# Patient Record
Sex: Female | Born: 1950 | Race: Black or African American | Hispanic: No | Marital: Married | Smoking: Former smoker
Health system: Southern US, Community
[De-identification: ages and names within clinical notes are randomized; demographics above are authoritative.]

## PROBLEM LIST (undated history)

## (undated) ENCOUNTER — Emergency Department (HOSPITAL_COMMUNITY): Admission: EM | Payer: Medicare Other | Source: Home / Self Care

## (undated) DIAGNOSIS — F32A Depression, unspecified: Secondary | ICD-10-CM

## (undated) DIAGNOSIS — T7840XA Allergy, unspecified, initial encounter: Secondary | ICD-10-CM

## (undated) DIAGNOSIS — I10 Essential (primary) hypertension: Secondary | ICD-10-CM

## (undated) DIAGNOSIS — F329 Major depressive disorder, single episode, unspecified: Secondary | ICD-10-CM

## (undated) DIAGNOSIS — J42 Unspecified chronic bronchitis: Secondary | ICD-10-CM

## (undated) DIAGNOSIS — I499 Cardiac arrhythmia, unspecified: Secondary | ICD-10-CM

## (undated) DIAGNOSIS — J449 Chronic obstructive pulmonary disease, unspecified: Secondary | ICD-10-CM

## (undated) DIAGNOSIS — F419 Anxiety disorder, unspecified: Secondary | ICD-10-CM

## (undated) HISTORY — DX: Anxiety disorder, unspecified: F41.9

## (undated) HISTORY — DX: Chronic obstructive pulmonary disease, unspecified: J44.9

## (undated) HISTORY — PX: ABDOMINAL HYSTERECTOMY: SHX81

## (undated) HISTORY — DX: Essential (primary) hypertension: I10

## (undated) HISTORY — DX: Cardiac arrhythmia, unspecified: I49.9

## (undated) HISTORY — DX: Depression, unspecified: F32.A

## (undated) HISTORY — PX: BREAST EXCISIONAL BIOPSY: SUR124

## (undated) HISTORY — DX: Unspecified chronic bronchitis: J42

## (undated) HISTORY — PX: BREAST LUMPECTOMY: SHX2

## (undated) HISTORY — DX: Major depressive disorder, single episode, unspecified: F32.9

## (undated) HISTORY — DX: Allergy, unspecified, initial encounter: T78.40XA

---

## 2010-10-11 HISTORY — PX: COLONOSCOPY: SHX174

## 2016-01-05 DIAGNOSIS — R05 Cough: Secondary | ICD-10-CM | POA: Diagnosis not present

## 2016-01-05 DIAGNOSIS — I10 Essential (primary) hypertension: Secondary | ICD-10-CM | POA: Diagnosis not present

## 2016-08-31 ENCOUNTER — Ambulatory Visit (INDEPENDENT_AMBULATORY_CARE_PROVIDER_SITE_OTHER): Payer: Medicare Other | Admitting: Nurse Practitioner

## 2016-08-31 ENCOUNTER — Encounter: Payer: Self-pay | Admitting: Nurse Practitioner

## 2016-08-31 VITALS — BP 126/84 | HR 58 | Temp 98.0°F | Resp 16 | Ht 59.0 in | Wt 150.0 lb

## 2016-08-31 DIAGNOSIS — J209 Acute bronchitis, unspecified: Secondary | ICD-10-CM | POA: Diagnosis not present

## 2016-08-31 DIAGNOSIS — J309 Allergic rhinitis, unspecified: Secondary | ICD-10-CM

## 2016-08-31 DIAGNOSIS — I10 Essential (primary) hypertension: Secondary | ICD-10-CM | POA: Diagnosis not present

## 2016-08-31 DIAGNOSIS — J42 Unspecified chronic bronchitis: Secondary | ICD-10-CM | POA: Insufficient documentation

## 2016-08-31 MED ORDER — FLUTICASONE PROPIONATE 50 MCG/ACT NA SUSP: 2.0000 | Freq: Every day | NASAL | 0 refills | Status: DC

## 2016-08-31 MED ORDER — CETIRIZINE HCL 10 MG PO TABS: 10.0000 mg | ORAL_TABLET | Freq: Every day | ORAL | 0 refills | Status: DC

## 2016-08-31 MED ORDER — ALBUTEROL SULFATE HFA 108 (90 BASE) MCG/ACT IN AERS: 1.0000 | INHALATION_SPRAY | Freq: Four times a day (QID) | RESPIRATORY_TRACT | 6 refills | Status: DC | PRN

## 2016-08-31 MED ORDER — VALSARTAN-HYDROCHLOROTHIAZIDE 160-12.5 MG PO TABS: 1.0000 | ORAL_TABLET | Freq: Every day | ORAL | 1 refills | Status: DC

## 2016-08-31 NOTE — Progress Notes (Signed)
Subjective:    Patient ID: Phyllis Johnson, female    DOB: July 02, 1951, 65 y.o.   MRN: [id number]  Patient presents today for complete physical or establish care (new patient).  Cough  Associated symptoms include shortness of breath. Pertinent negatives include no chest pain, fever, headaches, heartburn, myalgias, rash, sore throat, weight loss or wheezing. The symptoms are aggravated by lying down and exercise. She has tried nothing for the symptoms. Her past medical history is significant for bronchitis and COPD.  diagnosed with chronic bronchitis by previous pcp in [place]. Pulmonary function test was done. Denies used of any inhalers (states she only believes in healthy diet and regular exercise).  Previous pcp was Dr. Joya Salm ([place] in [place]), last seen 59months ago.  Immunizations: (TDAP, Hep C screen, Pneumovax, Influenza, zoster): Declined any immunization today. Health Maintenance  Topic Date Due  .  Hepatitis C: One time screening is recommended by Center for Disease Control  (CDC) for  adults born from [birth year] through 1963.   07-Jan-1951  . HIV Screening  01/12/1968  . Pap Smear  01/11/1974  . Mammogram  01/12/2003  . Colon Cancer Screening  01/12/2003  . Shingles Vaccine  01/11/2013  . Flu Shot  01/09/2017*  . Tetanus Vaccine  10/11/2018*  *Topic was postponed. The date shown is not the original due date.   Diet:heart healthy Weight:  Wt Readings from Last 3 Encounters:  08/31/16 150 lb (68 kg)   Exercise:walking Fall Risk:no No flowsheet data found. Home Safety: home alone, seperated from husband, retired Network engineer, moved from [place] to [facility] 02/2016 Depression/Suicide:denies, in remission with counseling and prayers No flowsheet data found. No flowsheet data found. Colonoscopy (every 5-97yrs, >50-51yrs):last done 2006, normal per patient Dexa (every 2-27yrs, >43yrs):last done 2006, reports osteopenia Pap Smear (every 30yrs for >21-29 without HPV, every 74yrs for  >30-83yrs with HPV):s/p hysterectomy Mammogram (yearly, >72yrs):due  Vision: due Dental: up to date Advanced Directive:no, wants information No flowsheet data found. Sexual History (birth control, marital status, STD):not sexually active  Medications and allergies reviewed with patient and updated if appropriate.  Patient Active Problem List   Diagnosis Date Noted  . HTN (hypertension), benign 08/31/2016  . Chronic bronchitis with acute exacerbation ([place]) 08/31/2016  . Chronic allergic rhinitis 08/31/2016    No current outpatient prescriptions on file prior to visit.   No current facility-administered medications on file prior to visit.     Past Medical History:  Diagnosis Date  . Allergy   . Anxiety    under control with prayer and exercise.  . Arrhythmia   . Chronic bronchitis ([place])   . COPD (chronic obstructive pulmonary disease) (HCC)    chronic bronchitis  . Depression    in remission with counselling  . Hypertension     Past Surgical History:  Procedure Laterality Date  . ABDOMINAL HYSTERECTOMY     with oophrectomy, secondary to large uterine fibriods  . BREAST LUMPECTOMY     R side    Social History   Social History  . Marital status: Unknown    Spouse name: N/A  . Number of children: N/A  . Years of education: N/A   Social History Main Topics  . Smoking status: Former Smoker    Years: 15.00    Types: Cigarettes    Quit date: 2008  . Smokeless tobacco: Never Used  . Alcohol use No  . Drug use: No  . Sexual activity: Not Asked   Other Topics Concern  .  None   Social History Narrative  . None    Family History  Problem Relation Age of Onset  . Hypertension Father   . Stroke Father   . Heart disease Father   . COPD Sister   . Cancer Sister     lung secondary to tobacco use  . Fibroids Sister     uterine  . Cancer Cousin     breast        Review of Systems  Constitutional: Negative for fever, malaise/fatigue and weight loss.    HENT: Negative for congestion and sore throat.   Eyes:       Negative for visual changes  Respiratory: Positive for cough and shortness of breath. Negative for sputum production and wheezing.   Cardiovascular: Negative for chest pain, palpitations and leg swelling.  Gastrointestinal: Negative for blood in stool, constipation, diarrhea and heartburn.  Genitourinary: Negative for dysuria, frequency and urgency.  Musculoskeletal: Negative for falls, joint pain and myalgias.  Skin: Negative for rash.  Neurological: Negative for dizziness, sensory change and headaches.  Endo/Heme/Allergies: Does not bruise/bleed easily.  Psychiatric/Behavioral: Negative for depression, substance abuse and suicidal ideas. The patient is not nervous/anxious and does not have insomnia.     Objective:   Vitals:   08/31/16 1110  BP: 126/84  Pulse: (!) 58  Resp: 16  Temp: 98 F (36.7 C)    Body mass index is 30.3 kg/m.   Physical Examination:  Physical Exam  Constitutional: She is oriented to person, place, and time and well-developed, well-nourished, and in no distress. No distress.  HENT:  Right Ear: External ear normal.  Left Ear: External ear normal.  Nose: Nose normal.  Mouth/Throat: Oropharynx is clear and moist. No oropharyngeal exudate.  Eyes: Conjunctivae and EOM are normal. Pupils are equal, round, and reactive to light. No scleral icterus.  Neck: Normal range of motion. Neck supple. No thyromegaly present.  Cardiovascular: Normal rate, normal heart sounds and intact distal pulses.   Pulmonary/Chest: Effort normal and breath sounds normal. She exhibits no tenderness.  Abdominal: Soft. Bowel sounds are normal. She exhibits no distension. There is no tenderness.  Musculoskeletal: Normal range of motion. She exhibits no edema or tenderness.  Lymphadenopathy:    She has no cervical adenopathy.  Neurological: She is alert and oriented to person, place, and time. Gait normal.  Skin: Skin is  warm and dry.  Psychiatric: Affect and judgment normal.    ASSESSMENT and PLAN:  Diagnoses and all orders for this visit:  Chronic bronchitis with acute exacerbation (HCC) -     albuterol (PROVENTIL HFA;VENTOLIN HFA) 108 (90 Base) MCG/ACT inhaler; Inhale 1-2 puffs into the lungs every 6 (six) hours as needed for wheezing or shortness of breath.  HTN (hypertension), benign -     valsartan-hydrochlorothiazide (DIOVAN-HCT) 160-12.5 MG tablet; Take 1 tablet by mouth daily. -     Basic metabolic panel; Future  Chronic allergic rhinitis, unspecified seasonality, unspecified trigger -     cetirizine (ZYRTEC) 10 MG tablet; Take 1 tablet (10 mg total) by mouth daily. -     fluticasone (FLONASE) 50 MCG/ACT nasal spray; Place 2 sprays into both nostrils daily.    No problem-specific Assessment & Plan notes found for this encounter.      Follow up: Return in about 1 month (around 09/30/2016) for CPE.  Wilfred Lacy, NP

## 2016-08-31 NOTE — Patient Instructions (Addendum)
Need medical records from previous provider. Will get lab draw with CPE in December.  Chronic Obstructive Pulmonary Disease Exacerbation Chronic obstructive pulmonary disease (COPD) is a common lung problem. In COPD, the flow of air from the lungs is limited. COPD exacerbations are times that breathing gets worse and you need extra treatment. Without treatment they can be life threatening. If they happen often, your lungs can become more damaged. If your COPD gets worse, your doctor may treat you with:  Medicines.  Oxygen.  Different ways to clear your airway, such as using a mask. Follow these instructions at home:  Do not smoke.  Avoid tobacco smoke and other things that bother your lungs.  If given, take your antibiotic medicine as told. Finish the medicine even if you start to feel better.  Only take medicines as told by your doctor.  Drink enough fluids to keep your pee (urine) clear or pale yellow (unless your doctor has told you not to).  Use a cool mist machine (vaporizer).  If you use oxygen or a machine that turns liquid medicine into a mist (nebulizer), continue to use them as told.  Keep up with shots (vaccinations) as told by your doctor.  Exercise regularly.  Eat healthy foods.  Keep all doctor visits as told. Get help right away if:  You are very short of breath and it gets worse.  You have trouble talking.  You have bad chest pain.  You have blood in your spit (sputum).  You have a fever.  You keep throwing up (vomiting).  You feel weak, or you pass out (faint).  You feel confused.  You keep getting worse. This information is not intended to replace advice given to you by your health care provider. Make sure you discuss any questions you have with your health care provider. Document Released: 09/16/2011 Document Revised: 03/04/2016 Document Reviewed: 06/01/2013 Elsevier Interactive Patient Education  2017 [employer].

## 2016-08-31 NOTE — Progress Notes (Signed)
Pre visit review using our clinic review tool, if applicable. No additional management support is needed unless otherwise documented below in the visit note. 

## 2016-09-18 ENCOUNTER — Emergency Department (HOSPITAL_COMMUNITY)
Admission: EM | Admit: 2016-09-18 | Discharge: 2016-09-18 | Disposition: A | Discharge status: Home / Self Care | Payer: Medicare Other | Attending: Emergency Medicine | Admitting: Emergency Medicine

## 2016-09-18 ENCOUNTER — Emergency Department (HOSPITAL_COMMUNITY): Payer: Medicare Other

## 2016-09-18 ENCOUNTER — Encounter (HOSPITAL_COMMUNITY): Payer: Self-pay

## 2016-09-18 DIAGNOSIS — Z87891 Personal history of nicotine dependence: Secondary | ICD-10-CM | POA: Insufficient documentation

## 2016-09-18 DIAGNOSIS — J449 Chronic obstructive pulmonary disease, unspecified: Secondary | ICD-10-CM | POA: Insufficient documentation

## 2016-09-18 DIAGNOSIS — R0789 Other chest pain: Secondary | ICD-10-CM | POA: Insufficient documentation

## 2016-09-18 DIAGNOSIS — F419 Anxiety disorder, unspecified: Secondary | ICD-10-CM | POA: Diagnosis not present

## 2016-09-18 DIAGNOSIS — R42 Dizziness and giddiness: Secondary | ICD-10-CM | POA: Diagnosis not present

## 2016-09-18 DIAGNOSIS — I10 Essential (primary) hypertension: Secondary | ICD-10-CM | POA: Diagnosis not present

## 2016-09-18 DIAGNOSIS — R079 Chest pain, unspecified: Secondary | ICD-10-CM | POA: Diagnosis not present

## 2016-09-18 LAB — CBC WITH DIFFERENTIAL/PLATELET
BASOS PCT: 0 %
Basophils Absolute: 0 10*3/uL (ref 0.0–0.1)
Eosinophils Absolute: 0.1 10*3/uL (ref 0.0–0.7)
Eosinophils Relative: 2 %
HCT: 39.9 % (ref 36.0–46.0)
HEMOGLOBIN: 13.5 g/dL (ref 12.0–15.0)
Lymphocytes Relative: 48 %
Lymphs Abs: 2.2 10*3/uL (ref 0.7–4.0)
MCH: 29.8 pg (ref 26.0–34.0)
MCHC: 33.8 g/dL (ref 30.0–36.0)
MCV: 88.1 fL (ref 78.0–100.0)
MONOS PCT: 7 %
Monocytes Absolute: 0.3 10*3/uL (ref 0.1–1.0)
NEUTROS PCT: 43 %
Neutro Abs: 2 10*3/uL (ref 1.7–7.7)
Platelets: 308 10*3/uL (ref 150–400)
RBC: 4.53 MIL/uL (ref 3.87–5.11)
RDW: 13.4 % (ref 11.5–15.5)
WBC: 4.6 10*3/uL (ref 4.0–10.5)

## 2016-09-18 LAB — BASIC METABOLIC PANEL
ANION GAP: 11 (ref 5–15)
BUN: 7 mg/dL (ref 6–20)
CALCIUM: 9.3 mg/dL (ref 8.9–10.3)
CO2: 23 mmol/L (ref 22–32)
Chloride: 100 mmol/L — ABNORMAL LOW (ref 101–111)
Creatinine, Ser: 0.7 mg/dL (ref 0.44–1.00)
GFR calc Af Amer: >60 mL/min (ref >60)
GLUCOSE: 102 mg/dL — AB (ref 65–99)
Potassium: 3.3 mmol/L — ABNORMAL LOW (ref 3.5–5.1)
Sodium: 134 mmol/L — ABNORMAL LOW (ref 135–145)

## 2016-09-18 LAB — I-STAT TROPONIN, ED
TROPONIN I, POC: 0 ng/mL (ref 0.00–0.08)
Troponin i, poc: 0 ng/mL (ref 0.00–0.08)

## 2016-09-18 MED ORDER — HYDROXYZINE HCL 25 MG PO TABS
50.0000 mg | ORAL_TABLET | Freq: Once | ORAL | Status: AC
  Administered 2016-09-18: 50 mg via ORAL
  Filled 2016-09-18: qty 2

## 2016-09-18 MED ORDER — ASPIRIN 81 MG PO CHEW
324.0000 mg | CHEWABLE_TABLET | Freq: Once | ORAL | Status: AC
  Administered 2016-09-18: 324 mg via ORAL
  Filled 2016-09-18: qty 4

## 2016-09-18 MED ORDER — HYDROXYZINE PAMOATE 25 MG PO CAPS: 25.0000 mg | ORAL_CAPSULE | Freq: Three times a day (TID) | ORAL | 0 refills | Status: DC | PRN

## 2016-09-18 NOTE — ED Triage Notes (Signed)
Pt. Having HTN 168/100 and pt. gpt 160/11 and pt. Took her Losartin 160-12.5 1 1/2 tablets,  PTAR rechecked her BP and it was 140/90.  Pt. Is feeling anxious and lightheaded and dizziness resolved.  Skin is warm and dry.

## 2016-09-18 NOTE — ED Notes (Signed)
Pt ambulated to and from the BR with no difficulty or assistance.

## 2016-09-18 NOTE — Discharge Instructions (Signed)
Please start taking a daily 81 mg aspirin.  Do not hesitate to return to the emergency department for any new, worsening or concerning symptoms.   Please consider following with cardiology for cardio allergy evaluation and possible repeat stress testing.

## 2016-09-18 NOTE — ED Provider Notes (Signed)
  Physical Exam  BP 135/91   Pulse (!) 56 Comment: Simultaneous filing. User may not have seen previous data.  Temp 97.6 F (36.4 C) (Oral)   Resp 12 Comment: Simultaneous filing. User may not have seen previous data.  Ht 4\' 11"  (1.499 m)   Wt 65.8 kg   SpO2 97% Comment: Simultaneous filing. User may not have seen previous data.  BMI 29.29 kg/m   Physical Exam  ED Course  Procedures  MDM Patient presented with lightheadedness has also nausea mildly elevated blood pressure per home readings. Blood pressures have been fine here without any signs of end organ damage on exam. EKG was noted to ischemic changes. First troponin negative, awaiting adult troponin  Delta trop and home, next at 1700  Second troponin is negative. Patient is reassured and agrees with plan follow-up with primary care provider for anxiety and for optimal hypertension control. Vital signs stable time of discharge. Strict return precautions are given.   Dewaine Conger, MD 09/18/16 [id number]    Pattricia Boss, MD 09/22/16 1324

## 2016-09-18 NOTE — ED Provider Notes (Signed)
[facility] DEPT Provider Note   CSN: [id number] Arrival date & time: 09/18/16  1255     History   Chief Complaint Chief Complaint  Patient presents with  . Hypertension  . Anxiety    HPI   Blood pressure 141/87, pulse 71, temperature 97.6 F (36.4 C), temperature source Oral, resp. rate (!) 28, height 4\' 11"  (1.499 m), weight 65.8 kg, SpO2 94 %.  Phyllis Johnson is a 65 y.o. female complaining of feeling lightheaded and slightly nauseous this morning at about 10 AM, she has a left-sided chest pressure very mild, 1 out of 10, no exacerbating or alleviating symptoms also onset at about 10 AM, she felt like her blood sugar was low, she checked her blood pressure is 168/100 client with her losartan. Lightheadedness and dizziness have resolved however she has some mild left-sided chest pressure. Former smoker with over 50-pack-year history, quit 10 years ago. She denies diabetes, hyperlipidemia, endorses family history of ACS. She has a history of anxiety however states this feels different in that she normally syncopized when she has panic attacks. No particular stressors in her life but states that her life is complicated she denies SI, HI, AVH, alcohol or drug abuse. She does report intermittently productive cough over the last several days with no pleuritic chest pain, fever, chills, shortness of breath.  Past Medical History:  Diagnosis Date  . Allergy   . Anxiety    under control with prayer and exercise.  . Arrhythmia   . Chronic bronchitis ([place])   . COPD (chronic obstructive pulmonary disease) (HCC)    chronic bronchitis  . Depression    in remission with counselling  . Hypertension     Patient Active Problem List   Diagnosis Date Noted  . HTN (hypertension), benign 08/31/2016  . Chronic bronchitis with acute exacerbation ([place]) 08/31/2016  . Chronic allergic rhinitis 08/31/2016    Past Surgical History:  Procedure Laterality Date  . ABDOMINAL HYSTERECTOMY     with  oophrectomy, secondary to large uterine fibriods  . BREAST LUMPECTOMY     R side    OB History    No data available       Home Medications    Prior to Admission medications   Medication Sig Start Date End Date Taking? Authorizing Provider  albuterol (PROVENTIL HFA;VENTOLIN HFA) 108 (90 Base) MCG/ACT inhaler Inhale 1-2 puffs into the lungs every 6 (six) hours as needed for wheezing or shortness of breath. 08/31/16   Flossie Buffy, NP  cetirizine (ZYRTEC) 10 MG tablet Take 1 tablet (10 mg total) by mouth daily. 08/31/16   Flossie Buffy, NP  fluticasone (FLONASE) 50 MCG/ACT nasal spray Place 2 sprays into both nostrils daily. 08/31/16   Flossie Buffy, NP  hydrOXYzine (VISTARIL) 25 MG capsule Take 1 capsule (25 mg total) by mouth 3 (three) times daily as needed. 09/18/16   Swayzie Choate, PA-C  valsartan-hydrochlorothiazide (DIOVAN-HCT) 160-12.5 MG tablet Take 1 tablet by mouth daily. 08/31/16   Flossie Buffy, NP    Family History Family History  Problem Relation Age of Onset  . Hypertension Father   . Stroke Father   . Heart disease Father   . COPD Sister   . Cancer Sister     lung secondary to tobacco use  . Fibroids Sister     uterine  . Cancer Cousin     breast    Social History Social History  Substance Use Topics  . Smoking status: Former Smoker  Years: 15.00    Types: Cigarettes    Quit date: 2008  . Smokeless tobacco: Never Used  . Alcohol use No     Allergies   Patient has no known allergies.   Review of Systems Review of Systems  10 systems reviewed and found to be negative, except as noted in the HPI.   Physical Exam Updated Vital Signs BP 143/91   Pulse (!) 58   Temp 97.6 F (36.4 C) (Oral)   Resp 19   Ht 4\' 11"  (1.499 m)   Wt 65.8 kg   SpO2 99%   BMI 29.29 kg/m   Physical Exam  Constitutional: She is oriented to person, place, and time. She appears well-developed and well-nourished. No distress.  HENT:  Head:  Normocephalic and atraumatic.  Mouth/Throat: Oropharynx is clear and moist.  Eyes: Conjunctivae and EOM are normal. Pupils are equal, round, and reactive to light.  Neck: Normal range of motion.  Cardiovascular: Normal rate, regular rhythm and intact distal pulses.   Pulmonary/Chest: Effort normal and breath sounds normal. No respiratory distress. She has no wheezes. She has no rales. She exhibits no tenderness.  Abdominal: Soft. She exhibits no distension and no mass. There is no tenderness. There is no rebound and no guarding. No hernia.  Musculoskeletal: Normal range of motion.  Neurological: She is alert and oriented to person, place, and time.  Skin: She is not diaphoretic.  Psychiatric:  Anxious  Nursing note and vitals reviewed.    ED Treatments / Results  Labs (all labs ordered are listed, but only abnormal results are displayed) Labs Reviewed  BASIC METABOLIC PANEL - Abnormal; Notable for the following:       Result Value   Sodium 134 (*)    Potassium 3.3 (*)    Chloride 100 (*)    Glucose, Bld 102 (*)    All other components within normal limits  CBC WITH DIFFERENTIAL/PLATELET  Randolm Idol, ED    EKG  EKG Interpretation  Date/Time:  Saturday September 18 2016 13:14:00 EST Ventricular Rate:  59 PR Interval:    QRS Duration: 88 QT Interval:  455 QTC Calculation: 451 R Axis:   -35 Text Interpretation:  Sinus rhythm Left axis deviation Abnormal R-wave progression, early transition Abnormal T, consider ischemia, anterior leads Baseline wander in lead(s) V4 V5 V6 No old tracing to compare Confirmed by [place]  MD, [place] [phone]) on 09/18/2016 2:01:31 PM       Radiology Dg Chest 2 View  Result Date: 09/18/2016 CLINICAL DATA:  Chest pain and HTN this morning (161/101) EXAM: CHEST  2 VIEW COMPARISON:  None. FINDINGS: The heart size and mediastinal contours are within normal limits. Both lungs are clear. The visualized skeletal structures are unremarkable.  IMPRESSION: No active cardiopulmonary disease. Electronically Signed   By: Nolon Nations M.D.   On: 09/18/2016 14:59    Procedures Procedures (including critical care time)  Medications Ordered in ED Medications  aspirin chewable tablet 324 mg (324 mg Oral Given 09/18/16 1352)  hydrOXYzine (ATARAX/VISTARIL) tablet 50 mg (50 mg Oral Given 09/18/16 1356)     Initial Impression / Assessment and Plan / ED Course  I have reviewed the triage vital signs and the nursing notes.  Pertinent labs & imaging results that were available during my care of the patient were reviewed by me and considered in my medical decision making (see chart for details).  Clinical Course     Vitals:   09/18/16 1448 09/18/16 1450 09/18/16  1500 09/18/16 1515  BP: 135/91 135/91 141/84 143/91  Pulse:  (!) 56 (!) 56 (!) 58  Resp:  12 11 19   Temp:      TempSrc:      SpO2:  97% 98% 99%  Weight:      Height:        Medications  aspirin chewable tablet 324 mg (324 mg Oral Given 09/18/16 1352)  hydrOXYzine (ATARAX/VISTARIL) tablet 50 mg (50 mg Oral Given 09/18/16 1356)    Phyllis Johnson is 65 y.o. female presenting with Lightheaded sensation, little bit of nausea and mildly elevated blood pressure has some left-sided chest pressure, she is low risk by heart score. EKG with no acute changes.  Troponin negative, patient reports complete resolution of her discomfort after Atarax. Will need repeat troponin, heart score is low. Advised her to start taking a daily low-dose aspirin. He has an appointment to establish primary care on the 21st of this month.  Case signed out to Dr. Ron Parker at shift change     Final Clinical Impressions(s) / ED Diagnoses   Final diagnoses:  Anxiety  Atypical chest pain    New Prescriptions New Prescriptions   HYDROXYZINE (VISTARIL) 25 MG CAPSULE    Take 1 capsule (25 mg total) by mouth 3 (three) times daily as needed.     Monico Blitz, PA-C 09/18/16 1559    Virgel Manifold, MD 09/20/16 1026

## 2016-09-18 NOTE — ED Notes (Signed)
ED Provider at bedside. 

## 2016-09-29 ENCOUNTER — Ambulatory Visit (INDEPENDENT_AMBULATORY_CARE_PROVIDER_SITE_OTHER): Payer: Medicare Other | Admitting: Nurse Practitioner

## 2016-09-29 ENCOUNTER — Other Ambulatory Visit (INDEPENDENT_AMBULATORY_CARE_PROVIDER_SITE_OTHER): Payer: Medicare Other

## 2016-09-29 ENCOUNTER — Other Ambulatory Visit (HOSPITAL_COMMUNITY)
Admission: RE | Admit: 2016-09-29 | Discharge: 2016-09-29 | Disposition: A | Discharge status: Home / Self Care | Payer: Medicare Other | Source: Ambulatory Visit | Attending: Nurse Practitioner | Admitting: Nurse Practitioner

## 2016-09-29 ENCOUNTER — Encounter: Payer: Self-pay | Admitting: Nurse Practitioner

## 2016-09-29 VITALS — BP 132/88 | HR 56 | Temp 97.6°F | Ht 59.0 in | Wt 149.0 lb

## 2016-09-29 DIAGNOSIS — Z124 Encounter for screening for malignant neoplasm of cervix: Secondary | ICD-10-CM

## 2016-09-29 DIAGNOSIS — Z13220 Encounter for screening for lipoid disorders: Secondary | ICD-10-CM | POA: Diagnosis not present

## 2016-09-29 DIAGNOSIS — Z01419 Encounter for gynecological examination (general) (routine) without abnormal findings: Secondary | ICD-10-CM | POA: Diagnosis not present

## 2016-09-29 DIAGNOSIS — F418 Other specified anxiety disorders: Secondary | ICD-10-CM | POA: Diagnosis not present

## 2016-09-29 DIAGNOSIS — Z1239 Encounter for other screening for malignant neoplasm of breast: Secondary | ICD-10-CM

## 2016-09-29 DIAGNOSIS — Z0000 Encounter for general adult medical examination without abnormal findings: Secondary | ICD-10-CM | POA: Diagnosis not present

## 2016-09-29 DIAGNOSIS — Z136 Encounter for screening for cardiovascular disorders: Secondary | ICD-10-CM | POA: Diagnosis not present

## 2016-09-29 DIAGNOSIS — Z7251 High risk heterosexual behavior: Secondary | ICD-10-CM

## 2016-09-29 DIAGNOSIS — I10 Essential (primary) hypertension: Secondary | ICD-10-CM

## 2016-09-29 DIAGNOSIS — F411 Generalized anxiety disorder: Secondary | ICD-10-CM | POA: Insufficient documentation

## 2016-09-29 DIAGNOSIS — Z1151 Encounter for screening for human papillomavirus (HPV): Secondary | ICD-10-CM | POA: Insufficient documentation

## 2016-09-29 DIAGNOSIS — Z1211 Encounter for screening for malignant neoplasm of colon: Secondary | ICD-10-CM

## 2016-09-29 DIAGNOSIS — Z1231 Encounter for screening mammogram for malignant neoplasm of breast: Secondary | ICD-10-CM | POA: Diagnosis not present

## 2016-09-29 LAB — COMPREHENSIVE METABOLIC PANEL
ALT: 15 U/L (ref 0–35)
AST: 15 U/L (ref 0–37)
Albumin: 4.4 g/dL (ref 3.5–5.2)
Alkaline Phosphatase: 84 U/L (ref 39–117)
BILIRUBIN TOTAL: 0.6 mg/dL (ref 0.2–1.2)
BUN: 9 mg/dL (ref 6–23)
CO2: 28 meq/L (ref 19–32)
CREATININE: 0.75 mg/dL (ref 0.40–1.20)
Calcium: 9.4 mg/dL (ref 8.4–10.5)
Chloride: 100 mEq/L (ref 96–112)
GFR: 82.76 mL/min (ref >60.00)
Glucose, Bld: 100 mg/dL — ABNORMAL HIGH (ref 70–99)
Potassium: 4.6 mEq/L (ref 3.5–5.1)
Sodium: 136 mEq/L (ref 135–145)
Total Protein: 7.2 g/dL (ref 6.0–8.3)

## 2016-09-29 LAB — LIPID PANEL
CHOL/HDL RATIO: 3
Cholesterol: 206 mg/dL — ABNORMAL HIGH (ref 0–200)
HDL: 76.9 mg/dL (ref >39.00)
LDL CALC: 111 mg/dL — AB (ref 0–99)
NonHDL: 129.49
Triglycerides: 92 mg/dL (ref 0.0–149.0)
VLDL: 18.4 mg/dL (ref 0.0–40.0)

## 2016-09-29 LAB — TSH: TSH: 1.13 u[IU]/mL (ref 0.35–4.50)

## 2016-09-29 NOTE — Patient Instructions (Addendum)
Prescribed potassium supplement if CMP indicates persistent hypokalemia.  Health Maintenance, Female Introduction Adopting a healthy lifestyle and getting preventive care can go a long way to promote health and wellness. Talk with your health care provider about what schedule of regular examinations is right for you. This is a good chance for you to check in with your provider about disease prevention and staying healthy. In between checkups, there are plenty of things you can do on your own. Experts have done a lot of research about which lifestyle changes and preventive measures are most likely to keep you healthy. Ask your health care provider for more information. Weight and diet Eat a healthy diet  Be sure to include plenty of vegetables, fruits, low-fat dairy products, and lean protein.  Do not eat a lot of foods high in solid fats, added sugars, or salt.  Get regular exercise. This is one of the most important things you can do for your health.  Most adults should exercise for at least 150 minutes each week. The exercise should increase your heart rate and make you sweat (moderate-intensity exercise).  Most adults should also do strengthening exercises at least twice a week. This is in addition to the moderate-intensity exercise. Maintain a healthy weight  Body mass index (BMI) is a measurement that can be used to identify possible weight problems. It estimates body fat based on height and weight. Your health care provider can help determine your BMI and help you achieve or maintain a healthy weight.  For females 63 years of age and older:  A BMI below 18.5 is considered underweight.  A BMI of 18.5 to 24.9 is normal.  A BMI of 25 to 29.9 is considered overweight.  A BMI of 30 and above is considered obese. Watch levels of cholesterol and blood lipids  You should start having your blood tested for lipids and cholesterol at 63 years of age, then have this test every 5  years.  You may need to have your cholesterol levels checked more often if:  Your lipid or cholesterol levels are high.  You are older than 63 years of age.  You are at high risk for heart disease. Cancer screening Lung Cancer  Lung cancer screening is recommended for adults 63-63 years old who are at high risk for lung cancer because of a history of smoking.  A yearly low-dose CT scan of the lungs is recommended for people who:  Currently smoke.  Have quit within the past 15 years.  Have at least a 30-pack-year history of smoking. A pack year is smoking an average of one pack of cigarettes a day for 1 year.  Yearly screening should continue until it has been 15 years since you quit.  Yearly screening should stop if you develop a health problem that would prevent you from having lung cancer treatment. Breast Cancer  Practice breast self-awareness. This means understanding how your breasts normally appear and feel.  It also means doing regular breast self-exams. Let your health care provider know about any changes, no matter how small.  If you are in your 20s or 30s, you should have a clinical breast exam (CBE) by a health care provider every 1-3 years as part of a regular health exam.  If you are 63 or older, have a CBE every year. Also consider having a breast X-ray (mammogram) every year.  If you have a family history of breast cancer, talk to your health care provider about genetic screening.  If you are at high risk for breast cancer, talk to your health care provider about having an MRI and a mammogram every year.  Breast cancer gene (BRCA) assessment is recommended for women who have family members with BRCA-related cancers. BRCA-related cancers include:  Breast.  Ovarian.  Tubal.  Peritoneal cancers.  Results of the assessment will determine the need for genetic counseling and BRCA1 and BRCA2 testing. Cervical Cancer  Your health care provider may recommend  that you be screened regularly for cancer of the pelvic organs (ovaries, uterus, and vagina). This screening involves a pelvic examination, including checking for microscopic changes to the surface of your cervix (Pap test). You may be encouraged to have this screening done every 3 years, beginning at age 63.  For women ages 63-63, health care providers may recommend pelvic exams and Pap testing every 3 years, or they may recommend the Pap and pelvic exam, combined with testing for human papilloma virus (HPV), every 5 years. Some types of HPV increase your risk of cervical cancer. Testing for HPV may also be done on women of any age with unclear Pap test results.  Other health care providers may not recommend any screening for nonpregnant women who are considered low risk for pelvic cancer and who do not have symptoms. Ask your health care provider if a screening pelvic exam is right for you.  If you have had past treatment for cervical cancer or a condition that could lead to cancer, you need Pap tests and screening for cancer for at least 20 years after your treatment. If Pap tests have been discontinued, your risk factors (such as having a new sexual partner) need to be reassessed to determine if screening should resume. Some women have medical problems that increase the chance of getting cervical cancer. In these cases, your health care provider may recommend more frequent screening and Pap tests. Colorectal Cancer  This type of cancer can be detected and often prevented.  Routine colorectal cancer screening usually begins at 63 years of age and continues through 63 years of age.  Your health care provider may recommend screening at an earlier age if you have risk factors for colon cancer.  Your health care provider may also recommend using home test kits to check for hidden blood in the stool.  A small camera at the end of a tube can be used to examine your colon directly (sigmoidoscopy or  colonoscopy). This is done to check for the earliest forms of colorectal cancer.  Routine screening usually begins at age 63.  Direct examination of the colon should be repeated every 5-10 years through 63 years of age. However, you may need to be screened more often if early forms of precancerous polyps or small growths are found. Skin Cancer  Check your skin from head to toe regularly.  Tell your health care provider about any new moles or changes in moles, especially if there is a change in a mole's shape or color.  Also tell your health care provider if you have a mole that is larger than the size of a pencil eraser.  Always use sunscreen. Apply sunscreen liberally and repeatedly throughout the day.  Protect yourself by wearing long sleeves, pants, a wide-brimmed hat, and sunglasses whenever you are outside. Heart disease, diabetes, and high blood pressure  High blood pressure causes heart disease and increases the risk of stroke. High blood pressure is more likely to develop in:  People who have blood pressure in the  high end of the normal range (130-139/85-89 mm Hg).  People who are overweight or obese.  People who are African American.  If you are 74-58 years of age, have your blood pressure checked every 3-5 years. If you are 34 years of age or older, have your blood pressure checked every year. You should have your blood pressure measured twice-once when you are at a hospital or clinic, and once when you are not at a hospital or clinic. Record the average of the two measurements. To check your blood pressure when you are not at a hospital or clinic, you can use:  An automated blood pressure machine at a pharmacy.  A home blood pressure monitor.  If you are between 64 years and 45 years old, ask your health care provider if you should take aspirin to prevent strokes.  Have regular diabetes screenings. This involves taking a blood sample to check your fasting blood sugar  level.  If you are at a normal weight and have a low risk for diabetes, have this test once every three years after 63 years of age.  If you are overweight and have a high risk for diabetes, consider being tested at a younger age or more often. Preventing infection Hepatitis B  If you have a higher risk for hepatitis B, you should be screened for this virus. You are considered at high risk for hepatitis B if:  You were born in a country where hepatitis B is common. Ask your health care provider which countries are considered high risk.  Your parents were born in a high-risk country, and you have not been immunized against hepatitis B (hepatitis B vaccine).  You have HIV or AIDS.  You use needles to inject street drugs.  You live with someone who has hepatitis B.  You have had sex with someone who has hepatitis B.  You get hemodialysis treatment.  You take certain medicines for conditions, including cancer, organ transplantation, and autoimmune conditions. Hepatitis C  Blood testing is recommended for:  Everyone born from [birth year] through 1963.  Anyone with known risk factors for hepatitis C. Sexually transmitted infections (STIs)  You should be screened for sexually transmitted infections (STIs) including gonorrhea and chlamydia if:  You are sexually active and are younger than 63 years of age.  You are older than 63 years of age and your health care provider tells you that you are at risk for this type of infection.  Your sexual activity has changed since you were last screened and you are at an increased risk for chlamydia or gonorrhea. Ask your health care provider if you are at risk.  If you do not have HIV, but are at risk, it may be recommended that you take a prescription medicine daily to prevent HIV infection. This is called pre-exposure prophylaxis (PrEP). You are considered at risk if:  You are sexually active and do not regularly use condoms or know the HIV status  of your partner(s).  You take drugs by injection.  You are sexually active with a partner who has HIV. Talk with your health care provider about whether you are at high risk of being infected with HIV. If you choose to begin PrEP, you should first be tested for HIV. You should then be tested every 3 months for as long as you are taking PrEP. Pregnancy  If you are premenopausal and you may become pregnant, ask your health care provider about preconception counseling.  If you may become  pregnant, take 400 to 800 micrograms (mcg) of folic acid every day.  If you want to prevent pregnancy, talk to your health care provider about birth control (contraception). Osteoporosis and menopause  Osteoporosis is a disease in which the bones lose minerals and strength with aging. This can result in serious bone fractures. Your risk for osteoporosis can be identified using a bone density scan.  If you are 75 years of age or older, or if you are at risk for osteoporosis and fractures, ask your health care provider if you should be screened.  Ask your health care provider whether you should take a calcium or vitamin D supplement to lower your risk for osteoporosis.  Menopause may have certain physical symptoms and risks.  Hormone replacement therapy may reduce some of these symptoms and risks. Talk to your health care provider about whether hormone replacement therapy is right for you. Follow these instructions at home:  Schedule regular health, dental, and eye exams.  Stay current with your immunizations.  Do not use any tobacco products including cigarettes, chewing tobacco, or electronic cigarettes.  If you are pregnant, do not drink alcohol.  If you are breastfeeding, limit how much and how often you drink alcohol.  Limit alcohol intake to no more than 1 drink per day for nonpregnant women. One drink equals 12 ounces of beer, 5 ounces of wine, or 1 ounces of hard liquor.  Do not use street  drugs.  Do not share needles.  Ask your health care provider for help if you need support or information about quitting drugs.  Tell your health care provider if you often feel depressed.  Tell your health care provider if you have ever been abused or do not feel safe at home. This information is not intended to replace advice given to you by your health care provider. Make sure you discuss any questions you have with your health care provider. Document Released: 04/12/2011 Document Revised: 03/04/2016 Document Reviewed: 07/01/2015  2017 [employer]

## 2016-09-29 NOTE — Progress Notes (Signed)
Subjective:    Patient ID: Phyllis Johnson, female    DOB: 1951/08/27, 65 y.o.   MRN: [id number]  Patient presents today for complete physical exam.  Anxiety  Presents for initial visit. Onset was 1 to 5 years ago. The problem has been waxing and waning. Symptoms include decreased concentration, depressed mood, excessive worry, muscle tension, nervous/anxious behavior, palpitations and restlessness. Patient reports no chest pain, compulsions, confusion, dizziness, hyperventilation, insomnia, nausea, shortness of breath or suicidal ideas. Symptoms occur most days. The severity of symptoms is causing significant distress. The symptoms are aggravated by family issues. The quality of sleep is fair.   There are no known risk factors. Her past medical history is significant for anxiety/panic attacks and depression. There is no history of suicide attempts. Past treatments include counseling (CBT). The treatment provided significant relief. Compliance with prior treatments has been good.   Only interested in counseling. occassional use of vistaril with significant relief. Triggered by family conflicts.  HTN: Has not had time to take BP medication this morning. Controlled with diovan HCTZ.  Immunizations: (TDAP, Hep C screen, Pneumovax, Influenza, zoster)  Health Maintenance  Topic Date Due  .  Hepatitis C: One time screening is recommended by Center for Disease Control  (CDC) for  adults born from [birth year] through 1963.   12-08-1950  . Pap Smear  01/11/1974  . Mammogram  01/12/2003  . Colon Cancer Screening  01/12/2003  . Flu Shot  01/09/2017*  . HIV Screening  09/12/2017*  . Shingles Vaccine  09/29/2017*  . Tetanus Vaccine  10/11/2018*  *Topic was postponed. The date shown is not the original due date.   Diet:heart healthy Weight:  Wt Readings from Last 3 Encounters:  09/29/16 149 lb (67.6 kg)  09/18/16 145 lb (65.8 kg)  08/31/16 150 lb (68 kg)   Exercise:waliking  Fall Risk:denies any  fall in last 19month No flowsheet data found. Home Safety:home alone Colonoscopy (every 5-11yr >50-7555yrneeded Pap Smear (every 49yr66yrr >21-29 without HPV, every 76yrs50yr >30-676yrs49yr HPV):needed, no hx of abnormal PAP Mammogram (yearly, >476yrs)649yred [facility]: needed, patient will schedule Advanced Directive: Advanced Directives 09/18/2016  Does Patient Have a Medical Advance Directive? No  Would patient like information on creating a medical advance directive? No - Patient declined   Sexual History (birth control, marital status, STD):divorced, not sexually active, has 3 adult children.  Medications and allergies reviewed with patient and updated if appropriate.  Patient Active Problem List   Diagnosis Date Noted  . Situational anxiety 09/29/2016  . HTN (hypertension), benign 08/31/2016  . Chronic bronchitis with acute exacerbation (HCC) [place]/2017  . Chronic allergic rhinitis 08/31/2016    Current Outpatient Prescriptions on File Prior to Visit  Medication Sig Dispense Refill  . albuterol (PROVENTIL HFA;VENTOLIN HFA) 108 (90 Base) MCG/ACT inhaler Inhale 1-2 puffs into the lungs every 6 (six) hours as needed for wheezing or shortness of breath. 1 Inhaler 6  . fluticasone (FLONASE) 50 MCG/ACT nasal spray Place 2 sprays into both nostrils daily. 16 g 0  . hydrOXYzine (VISTARIL) 25 MG capsule Take 1 capsule (25 mg total) by mouth 3 (three) times daily as needed. 30 capsule 0  . valsartan-hydrochlorothiazide (DIOVAN-HCT) 160-12.5 MG tablet Take 1 tablet by mouth daily. 90 tablet 1  . cetirizine (ZYRTEC) 10 MG tablet Take 1 tablet (10 mg total) by mouth daily. (Patient not taking: Reported on 09/29/2016) 30 tablet 0   No current facility-administered medications on file prior to visit.  Past Medical History:  Diagnosis Date  . Allergy   . Anxiety    under control with prayer and exercise.  . Arrhythmia   . Chronic bronchitis ([place])   . COPD (chronic  obstructive pulmonary disease) (HCC)    chronic bronchitis  . Depression    in remission with counselling  . Hypertension     Past Surgical History:  Procedure Laterality Date  . ABDOMINAL HYSTERECTOMY     with oophrectomy, secondary to large uterine fibriods  . BREAST LUMPECTOMY     R side    Social History   Social History  . Marital status: Unknown    Spouse name: N/A  . Number of children: N/A  . Years of education: N/A   Social History Main Topics  . Smoking status: Former Smoker    Years: 15.00    Types: Cigarettes    Quit date: 2008  . Smokeless tobacco: Never Used  . Alcohol use No  . Drug use: No  . Sexual activity: Not Asked   Other Topics Concern  . None   Social History Narrative  . None    Family History  Problem Relation Age of Onset  . Hypertension Father   . Stroke Father   . Heart disease Father   . COPD Sister   . Cancer Sister     lung secondary to tobacco use  . Fibroids Sister     uterine  . Cancer Cousin     breast       Review of Systems  Constitutional: Negative for fever, malaise/fatigue and weight loss.  HENT: Negative for congestion and sore throat.   Eyes:       Negative for visual changes  Respiratory: Negative for cough and shortness of breath.   Cardiovascular: Positive for palpitations. Negative for chest pain and leg swelling.  Gastrointestinal: Negative for blood in stool, constipation, diarrhea, heartburn and nausea.  Genitourinary: Negative for dysuria, frequency and urgency.  Musculoskeletal: Negative for falls, joint pain and myalgias.  Skin: Negative for rash.  Neurological: Negative for dizziness, sensory change and headaches.  Endo/Heme/Allergies: Does not bruise/bleed easily.  Psychiatric/Behavioral: Positive for decreased concentration. Negative for confusion, depression, substance abuse and suicidal ideas. The patient is nervous/anxious. The patient does not have insomnia.     Objective:   Vitals:    09/29/16 0931  BP: 132/88  Pulse: (!) 56  Temp: 97.6 F (36.4 C)    Body mass index is 30.09 kg/m.   Physical Examination:  Physical Exam  Constitutional: She is oriented to person, place, and time and well-developed, well-nourished, and in no distress. No distress.  HENT:  Right Ear: External ear normal.  Left Ear: External ear normal.  Nose: Nose normal.  Mouth/Throat: No oropharyngeal exudate.  Eyes: Conjunctivae and EOM are normal. Pupils are equal, round, and reactive to light. No scleral icterus.  Neck: Normal range of motion. Neck supple. No thyromegaly present.  Cardiovascular: Normal rate, regular rhythm, normal heart sounds and intact distal pulses.   Pulmonary/Chest: Effort normal and breath sounds normal. She exhibits no tenderness. Right breast exhibits no inverted nipple, no mass, no nipple discharge, no skin change and no tenderness. Left breast exhibits no inverted nipple, no mass, no nipple discharge, no skin change and no tenderness. Breasts are symmetrical.  Abdominal: Soft. Bowel sounds are normal. She exhibits no distension. There is no tenderness.  Genitourinary: Cervix normal, right adnexa normal, left adnexa normal and vulva normal. Rectal exam shows external hemorrhoid.  Rectal exam shows no tenderness. Cervix exhibits no motion tenderness. Thick  odorless  white and vaginal discharge found.  Musculoskeletal: Normal range of motion. She exhibits no edema or tenderness.  Lymphadenopathy:    She has no cervical adenopathy.  Neurological: She is alert and oriented to person, place, and time. Gait normal.  Skin: Skin is warm and dry.  Psychiatric: Affect and judgment normal.  Vitals reviewed.   ASSESSMENT and PLAN:  [place] was seen today for annual exam.  Diagnoses and all orders for this visit:  Preventative health care -     Comp Met (CMET); Future -     TSH; Future -     Lipid panel; Future -     MM Digital Screening; Future -     Ambulatory  referral to Gastroenterology -     Hepatitis C Antibody; Future -     Cytology - PAP  Situational anxiety -     TSH; Future -     Ambulatory referral to Psychology  HTN (hypertension), benign -     Comp Met (CMET); Future  Encounter for Papanicolaou smear for cervical cancer screening -     Cytology - PAP  Breast cancer screening -     MM Digital Screening; Future  Colon cancer screening -     Ambulatory referral to Gastroenterology  Unprotected sexual intercourse -     Hepatitis C Antibody; Future  Encounter for lipid screening for cardiovascular disease -     Lipid panel; Future   No problem-specific Assessment & Plan notes found for this encounter.     Follow up: Return in about 6 months (around 03/30/2017) for anxiety and HTN.  Wilfred Lacy, NP

## 2016-09-29 NOTE — Progress Notes (Signed)
Pre visit review using our clinic review tool, if applicable. No additional management support is needed unless otherwise documented below in the visit note. 

## 2016-09-30 LAB — HEPATITIS C ANTIBODY: HCV AB: NEGATIVE

## 2016-09-30 NOTE — Progress Notes (Signed)
Normal results

## 2016-10-01 LAB — CYTOLOGY - PAP
Diagnosis: NEGATIVE
HPV: NOT DETECTED

## 2016-10-06 ENCOUNTER — Telehealth: Payer: Self-pay | Admitting: Gastroenterology

## 2016-10-06 NOTE — Telephone Encounter (Signed)
Received colon/path report and placed on Dr. Doyne Keel desk for review. Dr. Havery Moros is Doc of the Day

## 2016-10-19 NOTE — Telephone Encounter (Signed)
Per Dr. Havery Moros patient is not due for a recall colon until Jan 2022.  Pt is aware and recall put in EPIC

## 2016-11-01 ENCOUNTER — Encounter (HOSPITAL_COMMUNITY): Payer: Self-pay | Admitting: Emergency Medicine

## 2016-11-01 ENCOUNTER — Emergency Department (HOSPITAL_BASED_OUTPATIENT_CLINIC_OR_DEPARTMENT_OTHER)
Admit: 2016-11-01 | Discharge: 2016-11-01 | Disposition: A | Discharge status: Home / Self Care | Payer: Medicare Other | Attending: Emergency Medicine | Admitting: Emergency Medicine

## 2016-11-01 ENCOUNTER — Emergency Department (HOSPITAL_COMMUNITY)
Admission: EM | Admit: 2016-11-01 | Discharge: 2016-11-01 | Disposition: A | Discharge status: Home / Self Care | Payer: Medicare Other | Attending: Emergency Medicine | Admitting: Emergency Medicine

## 2016-11-01 DIAGNOSIS — Y929 Unspecified place or not applicable: Secondary | ICD-10-CM | POA: Insufficient documentation

## 2016-11-01 DIAGNOSIS — Z87891 Personal history of nicotine dependence: Secondary | ICD-10-CM | POA: Insufficient documentation

## 2016-11-01 DIAGNOSIS — J441 Chronic obstructive pulmonary disease with (acute) exacerbation: Secondary | ICD-10-CM | POA: Diagnosis not present

## 2016-11-01 DIAGNOSIS — I10 Essential (primary) hypertension: Secondary | ICD-10-CM | POA: Diagnosis not present

## 2016-11-01 DIAGNOSIS — W228XXA Striking against or struck by other objects, initial encounter: Secondary | ICD-10-CM | POA: Insufficient documentation

## 2016-11-01 DIAGNOSIS — Y939 Activity, unspecified: Secondary | ICD-10-CM | POA: Diagnosis not present

## 2016-11-01 DIAGNOSIS — S40021A Contusion of right upper arm, initial encounter: Secondary | ICD-10-CM | POA: Insufficient documentation

## 2016-11-01 DIAGNOSIS — Y999 Unspecified external cause status: Secondary | ICD-10-CM | POA: Diagnosis not present

## 2016-11-01 DIAGNOSIS — Z79899 Other long term (current) drug therapy: Secondary | ICD-10-CM | POA: Diagnosis not present

## 2016-11-01 DIAGNOSIS — M79609 Pain in unspecified limb: Secondary | ICD-10-CM | POA: Diagnosis not present

## 2016-11-01 NOTE — ED Notes (Signed)
Pt transported to [facility]

## 2016-11-01 NOTE — ED Provider Notes (Signed)
[facility] DEPT Provider Note   CSN: [id number] Arrival date & time: 11/01/16  0907  By signing my name below, I, Sonum Patel, attest that this documentation has been prepared under the direction and in the presence of [facility], PA-C. Electronically Signed: Sonum Patel, Education administrator. 11/01/16. 11:41 AM.  History   Chief Complaint Chief Complaint  Patient presents with  . Fall    The history is provided by the patient. No language interpreter was used.      HPI Comments: Phyllis Johnson is a 66 y.o. female who presents to the Emergency Department complaining of constant, gradually improving area of bruising  to the right upper arm that began after a fall that occurred 5 days ago. No open wounds or decreased motion. She states she feels a hard area in the center of the affected area and is concerned for a blood clot. She has associated mild soreness to the upper arm. She states the area was where she struck the tub faucet. She has iced the area without relief. She denies numbness, weakness.   Past Medical History:  Diagnosis Date  . Allergy   . Anxiety    under control with prayer and exercise.  . Arrhythmia   . Chronic bronchitis ([place])   . COPD (chronic obstructive pulmonary disease) (HCC)    chronic bronchitis  . Depression    in remission with counselling  . Hypertension     Patient Active Problem List   Diagnosis Date Noted  . Situational anxiety 09/29/2016  . HTN (hypertension), benign 08/31/2016  . Chronic bronchitis with acute exacerbation ([place]) 08/31/2016  . Chronic allergic rhinitis 08/31/2016    Past Surgical History:  Procedure Laterality Date  . ABDOMINAL HYSTERECTOMY     with oophrectomy, secondary to large uterine fibriods  . BREAST LUMPECTOMY     R side    OB History    No data available       Home Medications    Prior to Admission medications   Medication Sig Start Date End Date Taking? Authorizing Provider  albuterol (PROVENTIL HFA;VENTOLIN  HFA) 108 (90 Base) MCG/ACT inhaler Inhale 1-2 puffs into the lungs every 6 (six) hours as needed for wheezing or shortness of breath. 08/31/16   Flossie Buffy, NP  cetirizine (ZYRTEC) 10 MG tablet Take 1 tablet (10 mg total) by mouth daily. Patient not taking: Reported on 09/29/2016 08/31/16   Flossie Buffy, NP  fluticasone (FLONASE) 50 MCG/ACT nasal spray Place 2 sprays into both nostrils daily. 08/31/16   Flossie Buffy, NP  hydrOXYzine (VISTARIL) 25 MG capsule Take 1 capsule (25 mg total) by mouth 3 (three) times daily as needed. 09/18/16   Nicole Pisciotta, PA-C  valsartan-hydrochlorothiazide (DIOVAN-HCT) 160-12.5 MG tablet Take 1 tablet by mouth daily. 08/31/16   Flossie Buffy, NP    Family History Family History  Problem Relation Age of Onset  . Hypertension Father   . Stroke Father   . Heart disease Father   . COPD Sister   . Cancer Sister     lung secondary to tobacco use  . Fibroids Sister     uterine  . Cancer Cousin     breast    Social History Social History  Substance Use Topics  . Smoking status: Former Smoker    Years: 15.00    Types: Cigarettes    Quit date: 2008  . Smokeless tobacco: Never Used  . Alcohol use No     Allergies   Patient  has no known allergies.   Review of Systems Review of Systems  Musculoskeletal: Positive for myalgias.  Skin: Positive for color change and wound.  Neurological: Negative for weakness and numbness.     Physical Exam Updated Vital Signs BP 144/91 (BP Location: Right Arm)   Pulse 62   Temp 98.3 F (36.8 C) (Oral)   Resp 18   SpO2 100%   Physical Exam  Constitutional: She is oriented to person, place, and time. She appears well-developed and well-nourished. No distress.  HENT:  Head: Normocephalic and atraumatic.  Cardiovascular: Normal rate, regular rhythm and normal heart sounds.   No murmur heard. Pulmonary/Chest: Effort normal and breath sounds normal. No respiratory distress.    Abdominal: Soft. She exhibits no distension. There is no tenderness.  Musculoskeletal: Normal range of motion. She exhibits tenderness. She exhibits no edema.  5/5 muscle strength of the RUE. Full ROM without pain. 2+ radial pulse and sensation intact.   Neurological: She is alert and oriented to person, place, and time.  Skin: Skin is warm and dry. No erythema.  Right tricep area with well healing ecchymosis which is tender to touch. Mild induration with no erythema or warmth.   Nursing note and vitals reviewed.    ED Treatments / Results  DIAGNOSTIC STUDIES: Oxygen Saturation is 100% on RA, normal by my interpretation.    COORDINATION OF CARE: 11:41 AM Discussed treatment plan with pt at bedside and pt agreed to plan.   Labs (all labs ordered are listed, but only abnormal results are displayed) Labs Reviewed - No data to display  EKG  EKG Interpretation None       Radiology No results found.  Procedures Procedures (including critical care time)  Medications Ordered in ED Medications - No data to display   Initial Impression / Assessment and Plan / ED Course  I have reviewed the triage vital signs and the nursing notes.  Pertinent labs & imaging results that were available during my care of the patient were reviewed by me and considered in my medical decision making (see chart for details).     Phyllis Johnson is a 66 y.o. female who presents to ED for Concerns of bruising to the right upper extremity after a fall 5 days ago. Patient is worried that she may have a blood clot as she had a family member who had one several years ago. On exam, patient has full range of motion and extremity is neurovascularly intact. There is ecchymosis in normal stages of healing with no open wounds. Ultrasound was obtained which was negative for clot. Symptomatic home care instructions discussed and all questions answered.  Final Clinical Impressions(s) / ED Diagnoses   Final  diagnoses:  Arm contusion, right, initial encounter    New Prescriptions New Prescriptions   No medications on file   I personally performed the services described in this documentation, which was scribed in my presence. The recorded information has been reviewed and is accurate.    [employer] [facility], PA-C 11/01/16 [place], MD 11/11/16 [phone]

## 2016-11-01 NOTE — ED Triage Notes (Signed)
Pt sts fall in bathtub x 3 days ago and hit right arm; pt sts painful bruising under arm; pt also sts cough

## 2016-11-01 NOTE — ED Notes (Signed)
ED Provider at bedside. 

## 2016-11-01 NOTE — Discharge Instructions (Signed)
Ice or heat to the affected area.  Follow up with your primary care provider if symptoms persist longer than one week.  Return to ER for new or worsening symptoms, any additional concerns.

## 2016-11-01 NOTE — Progress Notes (Signed)
VASCULAR LAB PRELIMINARY  PRELIMINARY  PRELIMINARY  PRELIMINARY  Right upper extremity venous duplex completed.    Preliminary report:  There is no DVT or SVT noted in the right upper extremity.  Called report to [facility], PA-C  Jalexus Brett, [facility], RVT 11/01/2016, 12:32 PM

## 2016-11-05 ENCOUNTER — Encounter: Payer: Self-pay | Admitting: Family Medicine

## 2016-11-05 ENCOUNTER — Ambulatory Visit (INDEPENDENT_AMBULATORY_CARE_PROVIDER_SITE_OTHER): Payer: Medicare Other | Admitting: Family Medicine

## 2016-11-05 ENCOUNTER — Other Ambulatory Visit (INDEPENDENT_AMBULATORY_CARE_PROVIDER_SITE_OTHER): Payer: Medicare Other

## 2016-11-05 ENCOUNTER — Ambulatory Visit (INDEPENDENT_AMBULATORY_CARE_PROVIDER_SITE_OTHER): Payer: Medicare Other

## 2016-11-05 VITALS — BP 112/68 | HR 64 | Temp 98.2°F | Ht 59.0 in | Wt 149.4 lb

## 2016-11-05 DIAGNOSIS — R001 Bradycardia, unspecified: Secondary | ICD-10-CM

## 2016-11-05 DIAGNOSIS — R05 Cough: Secondary | ICD-10-CM

## 2016-11-05 DIAGNOSIS — E871 Hypo-osmolality and hyponatremia: Secondary | ICD-10-CM | POA: Diagnosis not present

## 2016-11-05 DIAGNOSIS — R42 Dizziness and giddiness: Secondary | ICD-10-CM | POA: Diagnosis not present

## 2016-11-05 DIAGNOSIS — R0602 Shortness of breath: Secondary | ICD-10-CM | POA: Diagnosis not present

## 2016-11-05 DIAGNOSIS — J449 Chronic obstructive pulmonary disease, unspecified: Secondary | ICD-10-CM | POA: Diagnosis not present

## 2016-11-05 DIAGNOSIS — R059 Cough, unspecified: Secondary | ICD-10-CM

## 2016-11-05 LAB — COMPREHENSIVE METABOLIC PANEL
ALT: 23 U/L (ref 0–35)
AST: 22 U/L (ref 0–37)
Albumin: 4.3 g/dL (ref 3.5–5.2)
Alkaline Phosphatase: 90 U/L (ref 39–117)
BUN: 9 mg/dL (ref 6–23)
CO2: 31 mEq/L (ref 19–32)
Calcium: 9.2 mg/dL (ref 8.4–10.5)
Chloride: 95 mEq/L — ABNORMAL LOW (ref 96–112)
Creatinine, Ser: 0.83 mg/dL (ref 0.40–1.20)
GFR: 73.6 mL/min (ref >60.00)
Glucose, Bld: 110 mg/dL — ABNORMAL HIGH (ref 70–99)
Potassium: 3.6 mEq/L (ref 3.5–5.1)
Sodium: 133 mEq/L — ABNORMAL LOW (ref 135–145)
Total Bilirubin: 0.4 mg/dL (ref 0.2–1.2)
Total Protein: 6.9 g/dL (ref 6.0–8.3)

## 2016-11-05 LAB — CBC WITH DIFFERENTIAL/PLATELET
Basophils Absolute: 0 10*3/uL (ref 0.0–0.1)
Basophils Relative: 0.4 % (ref 0.0–3.0)
Eosinophils Absolute: 0 10*3/uL (ref 0.0–0.7)
Eosinophils Relative: 0.9 % (ref 0.0–5.0)
HCT: 41.5 % (ref 36.0–46.0)
Hemoglobin: 13.9 g/dL (ref 12.0–15.0)
Lymphocytes Relative: 47.7 % — ABNORMAL HIGH (ref 12.0–46.0)
Lymphs Abs: 1.5 10*3/uL (ref 0.7–4.0)
MCHC: 33.4 g/dL (ref 30.0–36.0)
MCV: 87.5 fl (ref 78.0–100.0)
Monocytes Absolute: 0.5 10*3/uL (ref 0.1–1.0)
Monocytes Relative: 14.4 % — ABNORMAL HIGH (ref 3.0–12.0)
Neutro Abs: 1.2 10*3/uL — ABNORMAL LOW (ref 1.4–7.7)
Neutrophils Relative %: 36.6 % — ABNORMAL LOW (ref 43.0–77.0)
Platelets: 276 10*3/uL (ref 150.0–400.0)
RBC: 4.75 Mil/uL (ref 3.87–5.11)
RDW: 13.8 % (ref 11.5–15.5)
WBC: 3.2 10*3/uL — ABNORMAL LOW (ref 4.0–10.5)

## 2016-11-05 LAB — TSH: TSH: 0.53 u[IU]/mL (ref 0.35–4.50)

## 2016-11-05 LAB — POCT URINALYSIS DIPSTICK
Bilirubin, UA: NEGATIVE
Blood, UA: NEGATIVE
Glucose, UA: NEGATIVE
Ketones, UA: NEGATIVE
Leukocytes, UA: NEGATIVE
Nitrite, UA: NEGATIVE
Protein, UA: NEGATIVE
Spec Grav, UA: 1.015
Urobilinogen, UA: 0.2
pH, UA: 6

## 2016-11-05 LAB — T4, FREE: Free T4: 0.75 ng/dL (ref 0.60–1.60)

## 2016-11-05 LAB — POC INFLUENZA A&B (BINAX/QUICKVUE)
Influenza A, POC: NEGATIVE
Influenza B, POC: NEGATIVE

## 2016-11-05 NOTE — Progress Notes (Signed)
Phyllis Johnson is a 66 y.o. female is here to discuss:   History of Present Illness:    Dizziness and fatigue. Patient states that she has been experiencing profound fatigue, lack of "go," decreased appetite, lightheadedness x 3 days. She went for a 1.5 mile walk to try to help get her energy back and it became worse. She endorses drinking plenty of water and eating, though admits that she is not eating much. Denies Ha, vertigo, chest pain, SOB, N/V/D/C, abdominal pain, edema, or rash. Had mild cough a few days ago that has now resolved. No fever or other URI s/s. No new medications or supplements. No known bleeding issues. No sick contacts. No travel. Stopped smoking 10 years ago. Recent CP R/O in ER 2 weeks ago. No head trauma. No Hx of the same.   PMHx, SurgHx, SocialHx, Medications, and Allergies were reviewed in the Visit Navigator and updated as appropriate.    Past Medical History:   Diagnosis Date  . Allergy   . Anxiety    under control with prayer and exercise.  . Arrhythmia   . Chronic bronchitis ([place])   . COPD (chronic obstructive pulmonary disease) (HCC)    chronic bronchitis  . Depression    in remission with counselling  . Hypertension      Past Surgical History:   Procedure Laterality Date  . ABDOMINAL HYSTERECTOMY     with oophrectomy, secondary to large uterine fibriods  . BREAST LUMPECTOMY     R side     Allergies:  No Known Allergies   Current Medications:   Current Outpatient Prescriptions:  .  aspirin EC 81 MG tablet, Take 81 mg by mouth daily., Disp: , Rfl:  .  hydrOXYzine (VISTARIL) 25 MG capsule, Take 1 capsule (25 mg total) by mouth 3 (three) times daily as needed., Disp: 30 capsule, Rfl: 0 .  valsartan-hydrochlorothiazide (DIOVAN-HCT) 160-12.5 MG tablet, Take 1 tablet by mouth daily., Disp: 90 tablet, Rfl: 1   Family History:   Problem Relation Age of Onset  . Hypertension Father   . Stroke Father   . Heart disease Father   . COPD  Sister   . Cancer Sister     lung secondary to tobacco use  . Fibroids Sister     uterine  . Cancer Cousin     breast     Social History:     Substance Use Topics  . Smoking status: Former Smoker    Years: 15.00    Types: Cigarettes    Quit date: 2008  . Smokeless tobacco: Never Used  . Alcohol use No      Review of Systems:    CONSTITUTIONAL: Denies: fever, chills, diaphoresis, weight loss, weight gain CARDIOVASCULAR: Denies: chest pain, dyspnea on exertion, orthopnea, paroxysmal nocturnal dyspnea, edema, palpitations RESPIRATORY: Denies: cough, hemoptysis, shortness of breath, pleuritic chest pain, wheezing GI: Denies: abdominal pain, flank pain, nausea, vomiting, diarrhea, constipation, black stool, blood in stool GU: Denies: dysuria, frequency/urgency, hematuria NEURO: Denies: /vertigo, headache, focal weakness, numbness/tingling, speech problems, loss of consciousness, confusion, memory loss MUSCULOSKELETAL: Denies: back pain, joint pain, joint stiffness, joint swelling, muscle pain, muscle weakness SKIN: Denies: rash, itching, hives PSYCH: Denies: anxiety, depression   Vitals:   Vitals:   11/05/16 0952  BP: 112/68  Pulse: 64  Temp: 98.2 F (36.8 C)  TempSrc: Oral  SpO2: 96%  Weight: 149 lb 6.4 oz (67.8 kg)  Height: 4\' 11"  (1.499 m)     Body mass index  is 30.18 kg/m.  Orthostatics: Lying - 132/84, 58; Sitting 120/76, 70; Standing 110/68, 75    Physical Exam:    General: Alert, cooperative, appears stated age and no distress.  HEENT:  Normocephalic, without obvious abnormality, atraumatic. Conjunctivae/corneas clear. PERRL, EOM's intact. Normal TM's and external ear canals both ears. Nares normal. Septum midline. Mucosa normal. No drainage or sinus tenderness. Lips, mucosa, and tongue normal; teeth and gums normal.  Lungs: Clear to auscultation bilaterally.  Heart:: Bradycardic, S1, S2 normal, no murmur, click, rub or gallop.  Abdomen: Soft,  non-tender; bowel sounds normal; no masses,  no organomegaly.  Extremities: Extremities normal, atraumatic, no cyanosis or edema.  Pulses: 2+ and symmetric.  Skin: Skin color, texture, turgor normal. No rashes or lesions.  Neurologic: Alert and oriented X 3, normal strength and tone. Normal symmetric. reflexes. Normal coordination and gait.  Psych: Alert,oriented, in NAD with a full range of affect, normal behavior and no psychotic features    Results for orders placed or performed in visit on 11/05/16  CBC with Differential/Platelet  Result Value Ref Range   WBC 3.2 (L) 4.0 - 10.5 K/uL   RBC 4.75 3.87 - 5.11 Mil/uL   Hemoglobin 13.9 12.0 - 15.0 g/dL   HCT 41.5 36.0 - 46.0 %   MCV 87.5 78.0 - 100.0 fl   MCHC 33.4 30.0 - 36.0 g/dL   RDW 13.8 11.5 - 15.5 %   Platelets 276.0 150.0 - 400.0 K/uL   Neutrophils Relative % 36.6 (L) 43.0 - 77.0 %   Lymphocytes Relative 47.7 (H) 12.0 - 46.0 %   Monocytes Relative 14.4 (H) 3.0 - 12.0 %   Eosinophils Relative 0.9 0.0 - 5.0 %   Basophils Relative 0.4 0.0 - 3.0 %   Neutro Abs 1.2 (L) 1.4 - 7.7 K/uL   Lymphs Abs 1.5 0.7 - 4.0 K/uL   Monocytes Absolute 0.5 0.1 - 1.0 K/uL   Eosinophils Absolute 0.0 0.0 - 0.7 K/uL   Basophils Absolute 0.0 0.0 - 0.1 K/uL  Comprehensive metabolic panel  Result Value Ref Range   Sodium 133 (L) 135 - 145 mEq/L   Potassium 3.6 3.5 - 5.1 mEq/L   Chloride 95 (L) 96 - 112 mEq/L   CO2 31 19 - 32 mEq/L   Glucose, Bld 110 (H) 70 - 99 mg/dL   BUN 9 6 - 23 mg/dL   Creatinine, Ser 0.83 0.40 - 1.20 mg/dL   Total Bilirubin 0.4 0.2 - 1.2 mg/dL   Alkaline Phosphatase 90 39 - 117 U/L   AST 22 0 - 37 U/L   ALT 23 0 - 35 U/L   Total Protein 6.9 6.0 - 8.3 g/dL   Albumin 4.3 3.5 - 5.2 g/dL   Calcium 9.2 8.4 - 10.5 mg/dL   GFR 73.60 >60.00 mL/min  POC Influenza A&B (Binax test)  Result Value Ref Range   Influenza A, POC Negative Negative   Influenza B, POC Negative Negative  POC Urinalysis Dipstick  Result Value Ref Range     Color, UA Yellow    Clarity, UA Clear    Glucose, UA Negative    Bilirubin, UA Negative    Ketones, UA Negative    Spec Grav, UA 1.015    Blood, UA Negative    pH, UA 6.0    Protein, UA Negative    Urobilinogen, UA 0.2    Nitrite, UA Negative    Leukocytes, UA Negative Negative   EKG: bradycardia, rate 53, unchanged from recent EKG.  Assessment and Plan:    Diagnoses and all orders for this visit:  Cough Comments: MInimal. Flu negative. Doubt acute illness. Orders: -     POC Influenza A&B (Binax test) -     DG Chest 2 View; Future  Lightheaded Comments: No syncope, nausea, or CP. Red flags reviewed. Orders: -     Orthostatic vital signs reviewed. HR compensating.  -     EKG 12-Lead unchanged. -     CBC with Differential/Platelet -     Comprehensive metabolic panel -     Ambulatory referral to Cardiology -     POC Urinalysis Dipstick  Bradycardia Comments: HR compensating with sitting and standing.  Orders: -     Ambulatory referral to Cardiology. Spoke with Dr. Harrington Challenger re: the patient's findings and timing for consult. I greatly appreciate her help today.  -     TSH + free T4  Hyponatremia Comments: Encouraged aggressive salt intake. Gatorade.    I spent 40 minutes with this patient, greater than 50% was face-to-face time counseling regarding the above diagnoses.   . Reviewed expectations re: course of current medical issues. . Discussed self-management of symptoms. . Outlined signs and symptoms indicating need for more acute intervention. . Patient verbalized understanding and all questions were answered. . See orders for this visit as documented in the electronic medical record. . Patient received an After-Visit Summary.   Briscoe Deutscher, D.O.

## 2016-11-05 NOTE — Addendum Note (Signed)
Addended by: Frutoso Chase A on: 11/05/2016 02:39 PM   Modules accepted: Orders

## 2016-11-05 NOTE — Progress Notes (Signed)
Pre visit review using our clinic review tool, if applicable. No additional management support is needed unless otherwise documented below in the visit note. 

## 2016-11-10 ENCOUNTER — Ambulatory Visit (INDEPENDENT_AMBULATORY_CARE_PROVIDER_SITE_OTHER): Payer: Medicare Other | Admitting: Internal Medicine

## 2016-11-10 ENCOUNTER — Encounter: Payer: Self-pay | Admitting: Internal Medicine

## 2016-11-10 VITALS — BP 122/82 | HR 57 | Ht 59.0 in | Wt 150.8 lb

## 2016-11-10 DIAGNOSIS — R9431 Abnormal electrocardiogram [ECG] [EKG]: Secondary | ICD-10-CM

## 2016-11-10 DIAGNOSIS — R0789 Other chest pain: Secondary | ICD-10-CM | POA: Diagnosis not present

## 2016-11-10 DIAGNOSIS — I10 Essential (primary) hypertension: Secondary | ICD-10-CM

## 2016-11-10 NOTE — Progress Notes (Signed)
New Outpatient Visit Date: 11/10/2016  Referring Provider: Briscoe Deutscher, [address] [place], [place] [address]  Chief Complaint: Lightheadedness and fatigue  HPI:  Ms. Bussie is a 66 y.o. year-old female with history of hypertension, who has been referred by Dr. Juleen China for atypical chest pain and lightheadedness/fatigue. Patient believes that she has had a cold or other infection for the last 1-2 weeks. She initially developed sore throat, headache, and chest congestion associated with poor appetite. She also had considerable fatigue for several days. She checked her blood pressure at home and noted it to be quite low (SBP in the 90's). At that time, she was also somewhat woozy. She temporarily held her blood pressure medication, taking it only when her blood pressure was back to her baseline with a systolic pressure around [id number] mmHg. Today, the patient reports feeling almost back to normal. She denies chest pain, shortness of breath, palpitations, lightheadedness, orthopnea, PND, and edema at this time.  It should be noted that she was seen in the ED in 09/2016 due to chest discomfort and congestion. She reports that she had pain across her lower chest worsened with coughing. She also was noted to have elevated blood pressure at that time. Troponins were negative 2. No further workup was pursued at that time, with her symptoms attributed to anxiety. She reports undergoing a stress test more than 10 years ago in [place] that showed an irregular heartbeat but otherwise no significant abnormalities. She has not undergone any additional cardiac evaluation.  --------------------------------------------------------------------------------------------------  Cardiovascular History & Procedures: Cardiovascular Problems:  Atypical chest pain  Irregular heartbeat  Risk Factors:  Hypertension, history of tobacco use, and history of remote crack-cocaine  use  Cath/PCI:  None  CV Surgery:  None  EP Procedures and Devices:  None  Non-Invasive Evaluation(s):  None available; patient reports stress test demonstrating irregular heartbeat but otherwise no significant abnormalities greater than 10 years ago in [place]  Recent CV Pertinent Labs: Lab Results  Component Value Date   CHOL 206 (H) 09/29/2016   HDL 76.90 09/29/2016   LDLCALC 111 (H) 09/29/2016   TRIG 92.0 09/29/2016   CHOLHDL 3 09/29/2016   K 3.6 11/05/2016   BUN 9 11/05/2016   CREATININE 0.83 11/05/2016    --------------------------------------------------------------------------------------------------  Past Medical History:  Diagnosis Date  . Allergy   . Anxiety    under control with prayer and exercise.  . Arrhythmia    "irregular heart beat" during stress test in [place]  . Chronic bronchitis ([place])   . COPD (chronic obstructive pulmonary disease) (HCC)    chronic bronchitis  . Depression    in remission with counselling  . Hypertension     Past Surgical History:  Procedure Laterality Date  . ABDOMINAL HYSTERECTOMY     with oophrectomy, secondary to large uterine fibriods  . BREAST LUMPECTOMY     R side    Outpatient Encounter Prescriptions as of 11/10/2016  Medication Sig  . aspirin EC 81 MG tablet Take 81 mg by mouth daily.  . valsartan-hydrochlorothiazide (DIOVAN-HCT) 160-12.5 MG tablet Take 1 tablet by mouth daily.  . [DISCONTINUED] hydrOXYzine (VISTARIL) 25 MG capsule Take 1 capsule (25 mg total) by mouth 3 (three) times daily as needed. (Patient not taking: Reported on 11/10/2016)   No facility-administered encounter medications on file as of 11/10/2016.     Allergies: Patient has no known allergies.  Social History   Social History  . Marital status: Unknown  Spouse name: N/A  . Number of children: N/A  . Years of education: N/A   Occupational History  . Not on file.   Social History Main Topics  . Smoking status: Former  Smoker    Packs/day: 1.50    Years: 30.00    Types: Cigarettes    Quit date: 2008  . Smokeless tobacco: Never Used  . Alcohol use No  . Drug use: No     Comment: Clean since 1993; previous crack-cocaine  . Sexual activity: Not on file   Other Topics Concern  . Not on file   Social History Narrative  . No narrative on file    Family History  Problem Relation Age of Onset  . Hypertension Father   . Stroke Father     hemorrhagic  . Heart disease Father   . Heart attack Father 8  . COPD Sister   . Cancer Sister     lung secondary to tobacco use  . Fibroids Sister     uterine  . Cancer Cousin     breast  . Alcohol abuse Mother     Review of Systems: A 12-system review of systems was performed and was negative except as noted in the HPI.  --------------------------------------------------------------------------------------------------  Physical Exam: BP 122/82 (BP Location: Left Arm, Patient Position: Sitting, Cuff Size: Normal)   Pulse (!) 57   Ht 4\' 11"  (1.499 m)   Wt 150 lb 12 oz (68.4 kg)   BMI 30.45 kg/m   General:  Overweight woman, seated comfortably in the exam room. HEENT: No conjunctival pallor or scleral icterus.  Moist mucous membranes.  OP clear. Neck: Supple without lymphadenopathy, thyromegaly, JVD, or HJR.  No carotid bruit. Lungs: Normal work of breathing.  Faint and expiratory wheezes. Good air movement without crackles. Heart: Regular rate and rhythm without murmurs, rubs, or gallops.  Non-displaced PMI. Abd: Bowel sounds present.  Soft, NT/ND without hepatosplenomegaly Ext: No lower extremity edema.  Radial, PT, and DP pulses are 2+ bilaterally Skin: warm and dry without rash Neuro: CNIII-XII intact.  Strength and fine-touch sensation intact in upper and lower extremities bilaterally. Psych: Normal mood and affect.  EKG:  Sinus bradycardia (heart rate 57 bpm), left axis deviation, and anteroseptal T wave inversions and subtle ST depression.  No significant change as far back as 09/18/16 (I have personally reviewed today's and all prior tracings).  Lab Results  Component Value Date   WBC 3.2 (L) 11/05/2016   HGB 13.9 11/05/2016   HCT 41.5 11/05/2016   MCV 87.5 11/05/2016   PLT 276.0 11/05/2016    Lab Results  Component Value Date   NA 133 (L) 11/05/2016   K 3.6 11/05/2016   CL 95 (L) 11/05/2016   CO2 31 11/05/2016   BUN 9 11/05/2016   CREATININE 0.83 11/05/2016   GLUCOSE 110 (H) 11/05/2016   ALT 23 11/05/2016    Lab Results  Component Value Date   CHOL 206 (H) 09/29/2016   HDL 76.90 09/29/2016   LDLCALC 111 (H) 09/29/2016   TRIG 92.0 09/29/2016   CHOLHDL 3 09/29/2016   --------------------------------------------------------------------------------------------------  ASSESSMENT AND PLAN: Atypical chest pain and abnormal EKG Patient's chest pain is atypical and most often associated with cough and chest congestion. She does not have any exertional symptoms and had been exercising regularly up until her recent respiratory illness. Her cardiac risk factors include hypertension, prior tobacco use, and remote history of crack-cocaine use. Her EKG today demonstrates anteroseptal T wave inversions and  subtle ST depressions, which appear stable as far back as 09/18/16. Given her risk factors and abnormal EKG, I feel that ischemia evaluation is warranted. We discussed further workup options, including noninvasive testing and cardiac catheterization; the patient would like to proceed with noninvasive testing. We will therefore obtain an exercise stress echocardiogram.  Fatigue and lightheadedness Symptoms seem to be improving spontaneously. I suspect much of this was due to an acute viral illness. She continues to have some chest congestion with faint wheezes on exam suggestive of possible bronchitis. If her symptoms do not continue to improve, she should follow-up with her PCP for further evaluation.  Hypertension Blood  pressure is normal today.  Patient should continue her current blood pressure regimen and follow-up with her PCP.  Follow-up: Return to clinic in 1 month (patient would prefer [facility]. office).  Nelva Bush, MD 11/11/2016 7:23 AM

## 2016-11-10 NOTE — Patient Instructions (Addendum)
Medication Instructions:  Your physician recommends that you continue on your current medications as directed. Please refer to the Current Medication list given to you today.   Labwork: none  Testing/Procedures: Your physician has requested that you have an EXERCISE STRESS echocardiogram. Echocardiography is a painless test that uses sound waves to create images of your heart. It provides your doctor with information about the size and shape of your heart and how well your heart's chambers and valves are working. You will walk on a treadmill to get your heart rate up then they will see how your heart looks. PLEASE SCHEDULE AT [facility].   DO NOT drink or eat foods with caffeine for 24 hours before the test. (Chocolate, coffee, tea, decaf coffee/tea, or energy drinks)  DO NOT smoke for 4 hours before your test.  If you use an inhaler, bring it with you to the test.  Wear comfortable shoes and clothing. Women do not wear dresses.   Follow-Up: Your physician recommends that you schedule a follow-up appointment in: [place] [address] END AT [facility].   Any Other Special Instructions Will Be Listed Below (If Applicable).   Exercise Stress Electrocardiogram An exercise stress electrocardiogram is a test that is done to evaluate the blood supply to your heart. This test may also be called exercise stress electrocardiography. The test is done while you are walking on a treadmill. The goal of this test is to raise your heart rate. This test is done to find areas of poor blood flow to the heart by determining the extent of coronary artery disease (CAD). CAD is defined as narrowing in one or more heart (coronary) arteries of more than 70%. If you have an abnormal test result, this may mean that you are not getting adequate blood flow to your heart during exercise. Additional testing may be needed to understand why your test was abnormal. Tell a health care provider about:  Any allergies  you have.  All medicines you are taking, including vitamins, herbs, eye drops, creams, and over-the-counter medicines.  Any problems you or family members have had with anesthetic medicines.  Any blood disorders you have.  Any surgeries you have had.  Any medical conditions you have.  Possibility of pregnancy, if this applies. What are the risks? Generally, this is a safe procedure. However, as with any procedure, complications can occur. Possible complications can include:  Pain or pressure in the following areas:  Chest.  Jaw or neck.  Between your shoulder blades.  Radiating down your left arm.  Dizziness or light-headedness.  Shortness of breath.  Increased or irregular heartbeats.  Nausea or vomiting.  Heart attack (rare). What happens before the procedure?  Avoid all forms of caffeine 24 hours before your test or as directed by your health care provider. This includes coffee, tea (even decaffeinated tea), caffeinated sodas, chocolate, cocoa, and certain pain medicines.  Follow your health care provider's instructions regarding eating and drinking before the test.  Take your medicines as directed at regular times with water unless instructed otherwise. Exceptions may include:  If you have diabetes, ask how you are to take your insulin or pills. It is common to adjust insulin dosing the morning of the test.  If you are taking beta-blocker medicines, it is important to talk to your health care provider about these medicines well before the date of your test. Taking beta-blocker medicines may interfere with the test. In some cases, these medicines need to be changed or  stopped 24 hours or more before the test.  If you wear a nitroglycerin patch, it may need to be removed prior to the test. Ask your health care provider if the patch should be removed before the test.  If you use an inhaler for any breathing condition, bring it with you to the test.  If you are an  outpatient, bring a snack so you can eat right after the stress phase of the test.  Do not smoke for 4 hours prior to the test or as directed by your health care provider.  Do not apply lotions, powders, creams, or oils on your chest prior to the test.  Wear loose-fitting clothes and comfortable shoes for the test. This test involves walking on a treadmill. What happens during the procedure?  Multiple patches (electrodes) will be put on your chest. If needed, small areas of your chest may have to be shaved to get better contact with the electrodes. Once the electrodes are attached to your body, multiple wires will be attached to the electrodes and your heart rate will be monitored.  Your heart will be monitored both at rest and while exercising.  You will walk on a treadmill. The treadmill will be started at a slow pace. The treadmill speed and incline will gradually be increased to raise your heart rate. What happens after the procedure?  Your heart rate and blood pressure will be monitored after the test.  You may return to your normal schedule including diet, activities, and medicines, unless your health care provider tells you otherwise. This information is not intended to replace advice given to you by your health care provider. Make sure you discuss any questions you have with your health care provider. Document Released: 09/24/2000 Document Revised: 03/04/2016 Document Reviewed: 06/04/2013 Elsevier Interactive Patient Education  2017 [employer].    If you need a refill on your cardiac medications before your next appointment, please call your pharmacy.

## 2016-11-11 ENCOUNTER — Encounter: Payer: Self-pay | Admitting: Internal Medicine

## 2016-11-11 DIAGNOSIS — R0789 Other chest pain: Secondary | ICD-10-CM

## 2016-11-11 DIAGNOSIS — R9431 Abnormal electrocardiogram [ECG] [EKG]: Secondary | ICD-10-CM | POA: Insufficient documentation

## 2016-11-11 HISTORY — DX: Other chest pain: R07.89

## 2016-11-12 ENCOUNTER — Other Ambulatory Visit: Payer: Self-pay | Admitting: *Deleted

## 2016-11-12 DIAGNOSIS — I10 Essential (primary) hypertension: Secondary | ICD-10-CM

## 2016-11-12 MED ORDER — VALSARTAN-HYDROCHLOROTHIAZIDE 160-12.5 MG PO TABS: 1.0000 | ORAL_TABLET | Freq: Every day | ORAL | 1 refills | Status: DC

## 2016-11-16 ENCOUNTER — Ambulatory Visit: Payer: Medicare Other | Admitting: General Practice

## 2016-11-16 ENCOUNTER — Ambulatory Visit: Payer: Medicare Other | Admitting: Physician Assistant

## 2016-11-16 VITALS — BP 130/98 | HR 70

## 2016-11-16 DIAGNOSIS — I10 Essential (primary) hypertension: Secondary | ICD-10-CM

## 2016-11-17 ENCOUNTER — Encounter: Payer: Self-pay | Admitting: Nurse Practitioner

## 2016-11-17 ENCOUNTER — Ambulatory Visit (INDEPENDENT_AMBULATORY_CARE_PROVIDER_SITE_OTHER): Payer: Medicare Other | Admitting: Nurse Practitioner

## 2016-11-17 VITALS — BP 112/80 | HR 62 | Temp 98.3°F | Ht 59.0 in | Wt 148.0 lb

## 2016-11-17 DIAGNOSIS — I10 Essential (primary) hypertension: Secondary | ICD-10-CM | POA: Diagnosis not present

## 2016-11-17 MED ORDER — VALSARTAN 160 MG PO TABS: 160.0000 mg | ORAL_TABLET | Freq: Every day | ORAL | 3 refills | Status: DC

## 2016-11-17 NOTE — Progress Notes (Signed)
Subjective:  Patient ID: Phyllis Johnson, female    DOB: [birth year]  Age: 66 y.o. MRN: [id number]  CC: Follow-up (BP/med consult/tired/slight pain right eyes(once in a while)(not sure if congestion can effect BP))  Hypertension  This is a chronic problem. The current episode started more than 1 year ago. The problem has been waxing and waning since onset. The problem is controlled. Associated symptoms include anxiety and malaise/fatigue. Pertinent negatives include no blurred vision, chest pain, headaches, neck pain, orthopnea, palpitations, peripheral edema, PND, shortness of breath or sweats. There are no associated agents to hypertension. Risk factors for coronary artery disease include obesity and post-menopausal state. Past treatments include angiotensin blockers. There are no compliance problems.  There is no history of a hypertension causing med, sleep apnea or a thyroid problem.  she was also evaluated by cardiologist due to abnormal ECG. Scheduled stress echocardiogram 12/02/2016. Home BP readings indicate low BP reading in morning (93/71 is the lowest), high BP reading in evening of 132/102.  Recent CMP indicates low sodium and chloride.  Outpatient Medications Prior to Visit  Medication Sig Dispense Refill  . aspirin EC 81 MG tablet Take 81 mg by mouth daily.    . valsartan-hydrochlorothiazide (DIOVAN-HCT) 160-12.5 MG tablet Take 1 tablet by mouth daily. 90 tablet 1   No facility-administered medications prior to visit.     ROS See HPI  Objective:  BP 112/80   Pulse 62   Temp 98.3 F (36.8 C)   Ht 4\' 11"  (1.499 m)   Wt 148 lb (67.1 kg)   SpO2 98%   BMI 29.89 kg/m   BP Readings from Last 3 Encounters:  11/17/16 112/80  11/16/16 (!) 130/98  11/10/16 122/82    Wt Readings from Last 3 Encounters:  11/17/16 148 lb (67.1 kg)  11/10/16 150 lb 12 oz (68.4 kg)  11/05/16 149 lb 6.4 oz (67.8 kg)    Physical Exam  Constitutional: She is oriented to person, place, and  time. No distress.  Cardiovascular: Normal rate.   Pulmonary/Chest: Effort normal.  Musculoskeletal: She exhibits no edema.  Neurological: She is alert and oriented to person, place, and time.  Vitals reviewed.   Lab Results  Component Value Date   WBC 3.2 (L) 11/05/2016   HGB 13.9 11/05/2016   HCT 41.5 11/05/2016   PLT 276.0 11/05/2016   GLUCOSE 110 (H) 11/05/2016   CHOL 206 (H) 09/29/2016   TRIG 92.0 09/29/2016   HDL 76.90 09/29/2016   LDLCALC 111 (H) 09/29/2016   ALT 23 11/05/2016   AST 22 11/05/2016   NA 133 (L) 11/05/2016   K 3.6 11/05/2016   CL 95 (L) 11/05/2016   CREATININE 0.83 11/05/2016   BUN 9 11/05/2016   CO2 31 11/05/2016   TSH 0.53 11/05/2016    No results found.  Assessment & Plan:   Phyllis Johnson was seen today for follow-up.  Diagnoses and all orders for this visit:  HTN (hypertension), benign -     valsartan (DIOVAN) 160 MG tablet; Take 1 tablet (160 mg total) by mouth daily. -     Basic Metabolic Panel (BMET); Future   I have discontinued Phyllis Johnson's valsartan-hydrochlorothiazide. I am also having her start on valsartan. Additionally, I am having her maintain her aspirin EC.  Meds ordered this encounter  Medications  . valsartan (DIOVAN) 160 MG tablet    Sig: Take 1 tablet (160 mg total) by mouth daily.    Dispense:  30 tablet    Refill:  3    Order Specific Question:   Supervising Provider    Answer:   Cassandria Anger [[id number]]     Follow-up: Return if symptoms worsen or fail to improve.  Wilfred Lacy, NP

## 2016-11-17 NOTE — Assessment & Plan Note (Addendum)
HCTZ eliminated due to low BP readings and abnormal electrolytes. Scheduled stress echocardiogram 12/02/2016. Repeat BMP in 2weeks

## 2016-11-17 NOTE — Progress Notes (Signed)
Pre visit review using our clinic review tool, if applicable. No additional management support is needed unless otherwise documented below in the visit note. 

## 2016-11-17 NOTE — Patient Instructions (Signed)
Continue to monitor BP once a day and record.  Bring BP readings to next Ov.  Go to lab for repeat BMP in 2weeks.

## 2016-11-25 DIAGNOSIS — S83249S Other tear of medial meniscus, current injury, unspecified knee, sequela: Secondary | ICD-10-CM | POA: Diagnosis not present

## 2016-11-25 DIAGNOSIS — S82832D Other fracture of upper and lower end of left fibula, subsequent encounter for closed fracture with routine healing: Secondary | ICD-10-CM | POA: Diagnosis not present

## 2016-12-02 ENCOUNTER — Other Ambulatory Visit (HOSPITAL_COMMUNITY): Payer: Medicare Other

## 2016-12-06 ENCOUNTER — Other Ambulatory Visit (INDEPENDENT_AMBULATORY_CARE_PROVIDER_SITE_OTHER): Payer: Medicare Other

## 2016-12-06 DIAGNOSIS — I10 Essential (primary) hypertension: Secondary | ICD-10-CM

## 2016-12-06 LAB — BASIC METABOLIC PANEL
BUN: 8 mg/dL (ref 6–23)
CALCIUM: 9.4 mg/dL (ref 8.4–10.5)
CO2: 26 mEq/L (ref 19–32)
Chloride: 104 mEq/L (ref 96–112)
Creatinine, Ser: 0.82 mg/dL (ref 0.40–1.20)
GFR: 74.62 mL/min (ref >60.00)
Glucose, Bld: 92 mg/dL (ref 70–99)
POTASSIUM: 4.3 meq/L (ref 3.5–5.1)
SODIUM: 139 meq/L (ref 135–145)

## 2016-12-09 ENCOUNTER — Telehealth (HOSPITAL_COMMUNITY): Payer: Self-pay | Admitting: *Deleted

## 2016-12-09 ENCOUNTER — Ambulatory Visit: Payer: Medicare Other | Admitting: Internal Medicine

## 2016-12-09 ENCOUNTER — Encounter: Payer: Self-pay | Admitting: Internal Medicine

## 2016-12-09 NOTE — Telephone Encounter (Signed)
Patient given detailed instructions per Stress Test Requisition Sheet for test on 12/13/16 at 2:30.Patient Notified to arrive 30 minutes early, and that it is imperative to arrive on time for appointment to keep from having the test rescheduled.  Patient verbalized understanding. Phyllis Johnson

## 2016-12-13 ENCOUNTER — Ambulatory Visit (HOSPITAL_BASED_OUTPATIENT_CLINIC_OR_DEPARTMENT_OTHER): Payer: Medicare Other

## 2016-12-13 ENCOUNTER — Ambulatory Visit (HOSPITAL_COMMUNITY): Payer: Medicare Other | Attending: Cardiovascular Disease

## 2016-12-13 DIAGNOSIS — R0789 Other chest pain: Secondary | ICD-10-CM | POA: Diagnosis not present

## 2016-12-13 DIAGNOSIS — I10 Essential (primary) hypertension: Secondary | ICD-10-CM

## 2016-12-13 DIAGNOSIS — R9431 Abnormal electrocardiogram [ECG] [EKG]: Secondary | ICD-10-CM

## 2016-12-13 DIAGNOSIS — R0989 Other specified symptoms and signs involving the circulatory and respiratory systems: Secondary | ICD-10-CM

## 2016-12-16 ENCOUNTER — Ambulatory Visit (INDEPENDENT_AMBULATORY_CARE_PROVIDER_SITE_OTHER): Payer: Medicare Other | Admitting: Internal Medicine

## 2016-12-16 ENCOUNTER — Encounter: Payer: Self-pay | Admitting: Internal Medicine

## 2016-12-16 ENCOUNTER — Encounter (INDEPENDENT_AMBULATORY_CARE_PROVIDER_SITE_OTHER): Payer: Self-pay

## 2016-12-16 VITALS — BP 116/80 | HR 52 | Ht 59.0 in | Wt 146.4 lb

## 2016-12-16 DIAGNOSIS — R0789 Other chest pain: Secondary | ICD-10-CM

## 2016-12-16 DIAGNOSIS — I10 Essential (primary) hypertension: Secondary | ICD-10-CM

## 2016-12-16 NOTE — Progress Notes (Signed)
9  Follow-up Outpatient Visit Date: 12/16/2016  Referring Provider: Flossie Buffy, NP [address], [place] [address]  Chief Complaint: Lightheadedness and fatigue  HPI:  Phyllis Johnson is a 66 y.o. year-old female with history of hypertension, who presents for follow-up of atypical chest pain and lightheadedness/fatigue. I last saw the patient on 11/10/16, at which time she was recovering from a cold. She had experienced some "wooziness" as well as chest pain across the lower abdomen with cough. Preceding ED evaluation had been negative. Though her pain was atypical, we agreed to perform an exercise stress echocardiogram due to multiple cardiac risk factors. Stress test was normal.  Today, the patient reports that she is feeling well. After our last visit, Phyllis Johnson' PCP discontinued HCTZ due to some electrolyte disturbances. Since that time, her lightheadedness has no longer been an issue. She also reports resolution of chest pain. She has not had any shortness of breath, palpitations, and edema. She is walking regularly without problems. She is somewhat concerned about her home blood pressure readings, as the diastolic pressures are after near 90. Systolic readings are typically 120-130.  --------------------------------------------------------------------------------------------------  Cardiovascular History & Procedures: Cardiovascular Problems:  Atypical chest pain  Irregular heartbeat  Risk Factors:  Hypertension, history of tobacco use, and history of remote crack-cocaine use  Cath/PCI:  None  CV Surgery:  None  EP Procedures and Devices:  None  Non-Invasive Evaluation(s):  Exercise stress echo (12/13/16): Normal study. Good exercise tolerance (8:11 minutes; 9.8 METS). No significant EKG changes. LVEF 55% at rest with normal hyperdynamic response without wall motion abnormalities.  Recent CV Pertinent Labs: Lab Results  Component Value Date   CHOL 206 (H)  09/29/2016   HDL 76.90 09/29/2016   LDLCALC 111 (H) 09/29/2016   TRIG 92.0 09/29/2016   CHOLHDL 3 09/29/2016   K 4.3 12/06/2016   BUN 8 12/06/2016   CREATININE 0.82 12/06/2016    --------------------------------------------------------------------------------------------------  Past Medical History:  Diagnosis Date  . Allergy   . Anxiety    under control with prayer and exercise.  . Arrhythmia    "irregular heart beat" during stress test in [place]  . Chronic bronchitis ([place])   . COPD (chronic obstructive pulmonary disease) ([facility])    chronic bronchitis  . Depression    in remission with counselling  . Hypertension     Past Surgical History:  Procedure Laterality Date  . ABDOMINAL HYSTERECTOMY     with oophrectomy, secondary to large uterine fibriods  . BREAST LUMPECTOMY     R side    Outpatient Encounter Prescriptions as of 12/16/2016  Medication Sig  . aspirin EC 81 MG tablet Take 81 mg by mouth daily.  . valsartan (DIOVAN) 160 MG tablet Take 1 tablet (160 mg total) by mouth daily.   No facility-administered encounter medications on file as of 12/16/2016.     Allergies: Patient has no known allergies.  Social History   Social History  . Marital status: Unknown    Spouse name: N/A  . Number of children: N/A  . Years of education: N/A   Occupational History  . Not on file.   Social History Main Topics  . Smoking status: Former Smoker    Packs/day: 1.50    Years: 30.00    Types: Cigarettes    Quit date: 2008  . Smokeless tobacco: Never Used  . Alcohol use No  . Drug use: No     Comment: Clean since 1993; previous crack-cocaine  .  Sexual activity: Not on file   Other Topics Concern  . Not on file   Social History Narrative  . No narrative on file    Family History  Problem Relation Age of Onset  . Hypertension Father   . Stroke Father     hemorrhagic  . Heart disease Father   . Heart attack Father 78  . COPD Sister   . Cancer Sister     lung  secondary to tobacco use  . Fibroids Sister     uterine  . Cancer Cousin     breast  . Alcohol abuse Mother     Review of Systems: A 12-system review of systems was performed and was negative except as noted in the HPI.  --------------------------------------------------------------------------------------------------  Physical Exam: BP 116/80   Pulse (!) 52   Ht 4\' 11"  (1.499 m)   Wt 146 lb 6.4 oz (66.4 kg)   BMI 29.57 kg/m   General:  Overweight woman, seated comfortably in the exam room. HEENT: No conjunctival pallor or scleral icterus.  Moist mucous membranes.  OP clear. Neck: Supple without lymphadenopathy, thyromegaly, JVD, or HJR. Lungs: Normal work of breathing.  Clear lungs without wheezes or crackles. Heart: Regular rate and rhythm without murmurs, rubs, or gallops.  Non-displaced PMI. Abd: Bowel sounds present.  Soft, NT/ND without hepatosplenomegaly Ext: No lower extremity edema.  Radial, PT, and DP pulses are 2+ bilaterally Skin: warm and dry without rash    Lab Results  Component Value Date   WBC 3.2 (L) 11/05/2016   HGB 13.9 11/05/2016   HCT 41.5 11/05/2016   MCV 87.5 11/05/2016   PLT 276.0 11/05/2016    Lab Results  Component Value Date   NA 139 12/06/2016   K 4.3 12/06/2016   CL 104 12/06/2016   CO2 26 12/06/2016   BUN 8 12/06/2016   CREATININE 0.82 12/06/2016   GLUCOSE 92 12/06/2016   ALT 23 11/05/2016    Lab Results  Component Value Date   CHOL 206 (H) 09/29/2016   HDL 76.90 09/29/2016   LDLCALC 111 (H) 09/29/2016   TRIG 92.0 09/29/2016   CHOLHDL 3 09/29/2016   --------------------------------------------------------------------------------------------------  ASSESSMENT AND PLAN: Atypical chest pain Pain has resolved since our last visit. I suspect symptoms were related to recent respiratory infection. Exercise stress echo was normal. I recommend continued primary prevention, which I will defer to Dr. Lorayne Marek.  Hypertension Blood  pressure is normal today. Patient is currently on valsartan alone, which she is tolerating well. Due to concerns about her home blood pressure readings, I advised Phyllis Johnson to bring her cuff with her to her next physician's visit in order to verify its accuracy.  Follow-up: Return to clinic as needed.  Nelva Bush, MD 12/16/2016 1:30 PM

## 2016-12-16 NOTE — Patient Instructions (Signed)
Medication Instructions:  Your physician recommends that you continue on your current medications as directed. Please refer to the Current Medication list given to you today.   Labwork: None   Testing/Procedures: None   Follow-Up: Your physician recommends that you schedule a follow-up appointment as needed with Dr End.       If you need a refill on your cardiac medications before your next appointment, please call your pharmacy.   

## 2016-12-17 ENCOUNTER — Encounter: Payer: Self-pay | Admitting: Internal Medicine

## 2017-01-04 ENCOUNTER — Ambulatory Visit (INDEPENDENT_AMBULATORY_CARE_PROVIDER_SITE_OTHER): Payer: Medicare Other | Admitting: Nurse Practitioner

## 2017-01-04 ENCOUNTER — Encounter: Payer: Self-pay | Admitting: Nurse Practitioner

## 2017-01-04 VITALS — BP 142/96 | HR 68 | Temp 97.8°F | Ht 59.0 in | Wt 147.0 lb

## 2017-01-04 DIAGNOSIS — I10 Essential (primary) hypertension: Secondary | ICD-10-CM | POA: Diagnosis not present

## 2017-01-04 MED ORDER — VALSARTAN 160 MG PO TABS: 160.0000 mg | ORAL_TABLET | Freq: Every day | ORAL | 3 refills | Status: DC

## 2017-01-04 NOTE — Patient Instructions (Signed)

## 2017-01-04 NOTE — Progress Notes (Signed)
Pre visit review using our clinic review tool, if applicable. No additional management support is needed unless otherwise documented below in the visit note. 

## 2017-01-04 NOTE — Progress Notes (Signed)
   Subjective:  Patient ID: Phyllis Johnson, female    DOB: [birth year]  Age: 66 y.o. MRN: [id number]  CC: Hypertension (BP go up and down. high in the morning,after take med go down and then back up after a while. )   HPI Ms. Busick presents with concerns of elevated BP despite use of BP medication (valsartan once every morning). Reports readings of 145/100 before medication. And 130/89 in evening. Denies any headache or chest pain or dizziness or edema or SOB. She also maintains low salt diet and walking daily.  Outpatient Medications Prior to Visit  Medication Sig Dispense Refill  . aspirin EC 81 MG tablet Take 81 mg by mouth daily.    . valsartan (DIOVAN) 160 MG tablet Take 1 tablet (160 mg total) by mouth daily. 30 tablet 3   No facility-administered medications prior to visit.     ROS See HPI  Objective:  BP (!) 142/96   Pulse 68   Temp 97.8 F (36.6 C)   Ht 4\' 11"  (1.499 m)   Wt 147 lb (66.7 kg)   SpO2 99%   BMI 29.69 kg/m   BP Readings from Last 3 Encounters:  01/04/17 (!) 142/96  12/16/16 116/80  11/17/16 112/80    Wt Readings from Last 3 Encounters:  01/04/17 147 lb (66.7 kg)  12/16/16 146 lb 6.4 oz (66.4 kg)  11/17/16 148 lb (67.1 kg)    Physical Exam  Constitutional: She is oriented to person, place, and time.  Cardiovascular: Normal rate, regular rhythm and normal heart sounds.   Pulmonary/Chest: Effort normal and breath sounds normal.  Musculoskeletal: Normal range of motion. She exhibits no edema.  Neurological: She is alert and oriented to person, place, and time.  Skin: Skin is warm and dry.  Vitals reviewed.   Lab Results  Component Value Date   WBC 3.2 (L) 11/05/2016   HGB 13.9 11/05/2016   HCT 41.5 11/05/2016   PLT 276.0 11/05/2016   GLUCOSE 92 12/06/2016   CHOL 206 (H) 09/29/2016   TRIG 92.0 09/29/2016   HDL 76.90 09/29/2016   LDLCALC 111 (H) 09/29/2016   ALT 23 11/05/2016   AST 22 11/05/2016   NA 139 12/06/2016   K 4.3  12/06/2016   CL 104 12/06/2016   CREATININE 0.82 12/06/2016   BUN 8 12/06/2016   CO2 26 12/06/2016   TSH 0.53 11/05/2016    No results found.  Assessment & Plan:   Georga was seen today for hypertension.  Diagnoses and all orders for this visit:  HTN (hypertension), benign -     valsartan (DIOVAN) 160 MG tablet; Take 1 tablet (160 mg total) by mouth daily.   I am having Ms. Rathbun maintain her aspirin EC and valsartan.  Meds ordered this encounter  Medications  . valsartan (DIOVAN) 160 MG tablet    Sig: Take 1 tablet (160 mg total) by mouth daily.    Dispense:  30 tablet    Refill:  3    Order Specific Question:   Supervising Provider    Answer:   Cassandria Anger [[id number]]    Follow-up: Return if symptoms worsen or fail to improve.  Wilfred Lacy, NP

## 2017-01-31 ENCOUNTER — Encounter: Payer: Self-pay | Admitting: Nurse Practitioner

## 2017-01-31 ENCOUNTER — Ambulatory Visit (INDEPENDENT_AMBULATORY_CARE_PROVIDER_SITE_OTHER): Payer: Medicare Other | Admitting: Nurse Practitioner

## 2017-01-31 ENCOUNTER — Other Ambulatory Visit: Payer: Self-pay | Admitting: Nurse Practitioner

## 2017-01-31 VITALS — BP 144/82 | HR 61 | Temp 98.0°F

## 2017-01-31 DIAGNOSIS — I10 Essential (primary) hypertension: Secondary | ICD-10-CM | POA: Diagnosis not present

## 2017-01-31 DIAGNOSIS — Z1231 Encounter for screening mammogram for malignant neoplasm of breast: Secondary | ICD-10-CM

## 2017-01-31 MED ORDER — AMLODIPINE BESYLATE 2.5 MG PO TABS: 2.5000 mg | ORAL_TABLET | Freq: Every day | ORAL | 3 refills | Status: DC

## 2017-01-31 NOTE — Progress Notes (Signed)
Subjective:  Patient ID: Phyllis Johnson, female    DOB: [birth year]  Age: 66 y.o. MRN: [id number]  CC: Follow-up (---high bp--- bp still running high, resting bp is higher in mornings--has list of documented bp readings for you to look at )  Hypertension  This is a chronic problem. The current episode started more than 1 year ago. The problem is unchanged. The problem is uncontrolled. Associated symptoms include anxiety and headaches. Pertinent negatives include no blurred vision, chest pain, malaise/fatigue, neck pain, orthopnea, palpitations, peripheral edema, PND, shortness of breath or sweats. There are no associated agents to hypertension. Risk factors for coronary artery disease include family history, obesity and post-menopausal state. Past treatments include angiotensin blockers. There are no compliance problems.     Outpatient Medications Prior to Visit  Medication Sig Dispense Refill  . valsartan (DIOVAN) 160 MG tablet Take 1 tablet (160 mg total) by mouth daily. 30 tablet 3  . aspirin EC 81 MG tablet Take 81 mg by mouth daily.     No facility-administered medications prior to visit.     ROS See HPI  Objective:  BP (!) 144/82   Pulse 61   Temp 98 F (36.7 C)   SpO2 99%   BP Readings from Last 3 Encounters:  01/31/17 (!) 144/82  01/04/17 (!) 142/96  12/16/16 116/80    Wt Readings from Last 3 Encounters:  01/04/17 147 lb (66.7 kg)  12/16/16 146 lb 6.4 oz (66.4 kg)  11/17/16 148 lb (67.1 kg)    Physical Exam  Constitutional: She is oriented to person, place, and time. No distress.  HENT:  Right Ear: External ear normal.  Left Ear: External ear normal.  Nose: Nose normal.  Mouth/Throat: No oropharyngeal exudate.  Eyes: No scleral icterus.  Neck: Normal range of motion. Neck supple.  Cardiovascular: Normal rate, regular rhythm and normal heart sounds.   Pulmonary/Chest: Effort normal and breath sounds normal. No respiratory distress.  Abdominal: Soft. She  exhibits no distension.  Musculoskeletal: Normal range of motion. She exhibits no edema.  Lymphadenopathy:    She has no cervical adenopathy.  Neurological: She is alert and oriented to person, place, and time.  Skin: Skin is warm and dry.  Psychiatric: She has a normal mood and affect. Her behavior is normal.    Lab Results  Component Value Date   WBC 3.2 (L) 11/05/2016   HGB 13.9 11/05/2016   HCT 41.5 11/05/2016   PLT 276.0 11/05/2016   GLUCOSE 92 12/06/2016   CHOL 206 (H) 09/29/2016   TRIG 92.0 09/29/2016   HDL 76.90 09/29/2016   LDLCALC 111 (H) 09/29/2016   ALT 23 11/05/2016   AST 22 11/05/2016   NA 139 12/06/2016   K 4.3 12/06/2016   CL 104 12/06/2016   CREATININE 0.82 12/06/2016   BUN 8 12/06/2016   CO2 26 12/06/2016   TSH 0.53 11/05/2016    No results found.  Assessment & Plan:   [place] was seen today for follow-up.  Diagnoses and all orders for this visit:  HTN (hypertension), benign -     amLODipine (NORVASC) 2.5 MG tablet; Take 1 tablet (2.5 mg total) by mouth at bedtime.   I am having Ms. Pfister start on amLODipine. I am also having her maintain her aspirin EC and valsartan.  Meds ordered this encounter  Medications  . amLODipine (NORVASC) 2.5 MG tablet    Sig: Take 1 tablet (2.5 mg total) by mouth at bedtime.    Dispense:  30 tablet    Refill:  3    Order Specific Question:   Supervising Provider    Answer:   Cassandria Anger [[id number]]    Follow-up: Return for maintain upcoming appt 03/29/17.  Wilfred Lacy, NP

## 2017-01-31 NOTE — Patient Instructions (Signed)
You will be contacted to schedule mammogram.  Continue BP check and recording once a day.  Maintain upcoming appt in June.

## 2017-01-31 NOTE — Progress Notes (Signed)
Pre visit review using our clinic review tool, if applicable. No additional management support is needed unless otherwise documented below in the visit note. 

## 2017-02-09 ENCOUNTER — Telehealth: Payer: Self-pay | Admitting: Nurse Practitioner

## 2017-02-09 NOTE — Telephone Encounter (Signed)
Ok to hold prescription for now if that is what she wants to do. She should maintain upcoming appt in June. She should also continue checking BP at least 3times a week and record. Bring BP records to next OV.

## 2017-02-09 NOTE — Telephone Encounter (Signed)
Pt called and said that she would like to cancel the prescription for amLODipine. She said that she does not think that she needs it right now.

## 2017-02-09 NOTE — Telephone Encounter (Signed)
Pt verbalized understand of massage below.

## 2017-02-21 ENCOUNTER — Encounter: Payer: Self-pay | Admitting: Radiology

## 2017-02-21 ENCOUNTER — Ambulatory Visit
Admission: RE | Admit: 2017-02-21 | Discharge: 2017-02-21 | Disposition: A | Discharge status: Home / Self Care | Payer: Medicare Other | Source: Ambulatory Visit | Attending: Nurse Practitioner | Admitting: Nurse Practitioner

## 2017-02-21 DIAGNOSIS — Z1231 Encounter for screening mammogram for malignant neoplasm of breast: Secondary | ICD-10-CM

## 2017-03-29 ENCOUNTER — Encounter: Payer: Self-pay | Admitting: Nurse Practitioner

## 2017-03-29 ENCOUNTER — Ambulatory Visit (INDEPENDENT_AMBULATORY_CARE_PROVIDER_SITE_OTHER): Payer: Medicare Other | Admitting: Nurse Practitioner

## 2017-03-29 DIAGNOSIS — I10 Essential (primary) hypertension: Secondary | ICD-10-CM

## 2017-03-29 MED ORDER — VALSARTAN 160 MG PO TABS: 160.0000 mg | ORAL_TABLET | Freq: Every day | ORAL | 1 refills | Status: DC

## 2017-03-29 NOTE — Assessment & Plan Note (Signed)
Controlled with valsartan. Home readings of 110/80 to 150/90.

## 2017-03-29 NOTE — Progress Notes (Addendum)
Subjective:  Patient ID: Phyllis Johnson, female    DOB: 12/26/1950  Age: 66 y.o. MRN: [id number]  CC: Follow-up (6 mo fu/BP  is better.--just took her BP med 30 ago)   HPI   HTN: Controlled with valsartan only. Home readings: 110/80 -150/90.  Outpatient Medications Prior to Visit  Medication Sig Dispense Refill  . valsartan (DIOVAN) 160 MG tablet Take 1 tablet (160 mg total) by mouth daily. 30 tablet 3  . aspirin EC 81 MG tablet Take 81 mg by mouth daily.    Marland Kitchen amLODipine (NORVASC) 2.5 MG tablet Take 1 tablet (2.5 mg total) by mouth at bedtime. (Patient not taking: Reported on 03/29/2017) 30 tablet 3   No facility-administered medications prior to visit.     ROS Review of Systems  Constitutional: Negative for malaise/fatigue.  Eyes: Negative for blurred vision.  Respiratory: Negative for cough and shortness of breath.   Cardiovascular: Negative for chest pain, palpitations and leg swelling.  Skin: Negative.   Neurological: Negative for dizziness and headaches.  Psychiatric/Behavioral: Negative for depression. The patient is not nervous/anxious.      Objective:  BP (!) 142/100   Pulse 65   Temp 98.5 F (36.9 C)   Ht 4\' 11"  (1.499 m)   Wt 147 lb (66.7 kg)   SpO2 99%   BMI 29.69 kg/m   BP Readings from Last 3 Encounters:  03/29/17 (!) 142/100  01/31/17 (!) 144/82  01/04/17 (!) 142/96    Wt Readings from Last 3 Encounters:  03/29/17 147 lb (66.7 kg)  01/04/17 147 lb (66.7 kg)  12/16/16 146 lb 6.4 oz (66.4 kg)    Physical Exam  Constitutional: She is oriented to person, place, and time. No distress.  Neck: Normal range of motion. Neck supple. No JVD present.  Cardiovascular: Normal rate and normal heart sounds.   Pulmonary/Chest: Effort normal and breath sounds normal.  Musculoskeletal: She exhibits no edema.  Neurological: She is alert and oriented to person, place, and time.  Skin: Skin is warm and dry.  Vitals reviewed.   Lab Results  Component Value  Date   WBC 3.2 (L) 11/05/2016   HGB 13.9 11/05/2016   HCT 41.5 11/05/2016   PLT 276.0 11/05/2016   GLUCOSE 92 12/06/2016   CHOL 206 (H) 09/29/2016   TRIG 92.0 09/29/2016   HDL 76.90 09/29/2016   LDLCALC 111 (H) 09/29/2016   ALT 23 11/05/2016   AST 22 11/05/2016   NA 139 12/06/2016   K 4.3 12/06/2016   CL 104 12/06/2016   CREATININE 0.82 12/06/2016   BUN 8 12/06/2016   CO2 26 12/06/2016   TSH 0.53 11/05/2016    Mm Screening Breast Tomo Bilateral  Result Date: 02/22/2017 CLINICAL DATA:  Screening. EXAM: 2D DIGITAL SCREENING BILATERAL MAMMOGRAM WITH CAD AND ADJUNCT TOMO COMPARISON:  Previous exam(s). ACR Breast Density Category b: There are scattered areas of fibroglandular density. FINDINGS: There are no findings suspicious for malignancy. Images were processed with CAD. IMPRESSION: No mammographic evidence of malignancy. A result letter of this screening mammogram will be mailed directly to the patient. RECOMMENDATION: Screening mammogram in one year. (Code:SM-B-01Y) BI-RADS CATEGORY  1: Negative. Electronically Signed   By: Lajean Manes M.D.   On: 02/22/2017 11:15    Assessment & Plan:   Jacquita was seen today for follow-up.  Diagnoses and all orders for this visit:  HTN (hypertension), benign -     valsartan (DIOVAN) 160 MG tablet; Take 1 tablet (160 mg total) by  mouth daily.   I have discontinued Ms. Ganoe's amLODipine. I am also having her maintain her aspirin EC and valsartan.  Meds ordered this encounter  Medications  . valsartan (DIOVAN) 160 MG tablet    Sig: Take 1 tablet (160 mg total) by mouth daily.    Dispense:  90 tablet    Refill:  1    Order Specific Question:   Supervising Provider    Answer:   Cassandria Anger [[id number]]    Follow-up: Return in about 6 months (around 09/28/2017) for CPE (fasting).  Wilfred Lacy, NP

## 2017-09-29 ENCOUNTER — Encounter: Payer: Medicare Other | Admitting: Nurse Practitioner

## 2017-10-18 ENCOUNTER — Ambulatory Visit: Payer: Medicare Other | Admitting: Nurse Practitioner

## 2017-10-21 ENCOUNTER — Encounter: Payer: Self-pay | Admitting: Nurse Practitioner

## 2017-10-21 ENCOUNTER — Ambulatory Visit (INDEPENDENT_AMBULATORY_CARE_PROVIDER_SITE_OTHER): Payer: Medicare Other | Admitting: Nurse Practitioner

## 2017-10-21 VITALS — BP 126/90 | HR 62 | Temp 98.2°F | Ht 59.0 in | Wt 152.0 lb

## 2017-10-21 DIAGNOSIS — Z780 Asymptomatic menopausal state: Secondary | ICD-10-CM | POA: Diagnosis not present

## 2017-10-21 DIAGNOSIS — Z0100 Encounter for examination of eyes and vision without abnormal findings: Secondary | ICD-10-CM | POA: Diagnosis not present

## 2017-10-21 DIAGNOSIS — Z0000 Encounter for general adult medical examination without abnormal findings: Secondary | ICD-10-CM

## 2017-10-21 NOTE — Progress Notes (Signed)
.cn   Subjective:   Phyllis Johnson is a 67 y.o. female who presents for Medicare Annual (Subsequent) preventive examination.  Review of Systems:  Review of Systems  Constitutional: Negative for fever, malaise/fatigue and weight loss.  HENT: Negative for congestion and sore throat.   Eyes:       Negative for visual changes  Respiratory: Negative for cough and shortness of breath.   Cardiovascular: Negative for chest pain, palpitations and leg swelling.  Gastrointestinal: Negative for blood in stool, constipation, diarrhea and heartburn.  Genitourinary: Negative for dysuria, frequency and urgency.  Musculoskeletal: Negative for falls, joint pain and myalgias.  Skin: Negative for rash.  Neurological: Negative for dizziness, sensory change and headaches.  Endo/Heme/Allergies: Does not bruise/bleed easily.  Psychiatric/Behavioral: Negative for depression, memory loss, substance abuse and suicidal ideas. The patient is not nervous/anxious and does not have insomnia.         Objective:     Vitals: BP 126/90   Pulse 62   Temp 98.2 F (36.8 C) (Oral)   Ht 4\' 11"  (1.499 m)   Wt 152 lb (68.9 kg)   SpO2 97%   BMI 30.70 kg/m   Body mass index is 30.7 kg/m.  Advanced Directives 11/01/2016 09/18/2016  Does Patient Have a Medical Advance Directive? No No  Would patient like information on creating a medical advance directive? - No - Patient declined    Tobacco Social History   Tobacco Use  Smoking Status Former Smoker  . Packs/day: 1.50  . Years: 30.00  . Pack years: 45.00  . Types: Cigarettes  . Last attempt to quit: 2008  . Years since quitting: 11.0  Smokeless Tobacco Never Used     Counseling given: Not Answered  Clinical Intake:     Depression screen [facility] 2/9 10/21/2017 10/21/2017  Decreased Interest 0 0  Down, Depressed, Hopeless 0 0  PHQ - 2 Score 0 0   MMSE - Mini Mental State Exam 10/21/2017  Orientation to time 5  Orientation to Place 5  Registration 3    Attention/ Calculation 5  Recall 3  Language- name 2 objects 2  Language- repeat 1  Language- follow 3 step command 3  Language- read & follow direction 1  Write a sentence 1  Copy design 1  Total score 30    Past Medical History:  Diagnosis Date  . Allergy   . Anxiety    under control with prayer and exercise.  . Arrhythmia    "irregular heart beat" during stress test in [place]  . Chronic bronchitis ([place])   . COPD (chronic obstructive pulmonary disease) ([facility])    chronic bronchitis  . Depression    in remission with counselling  . Hypertension    Past Surgical History:  Procedure Laterality Date  . ABDOMINAL HYSTERECTOMY     with oophrectomy, secondary to large uterine fibriods  . BREAST LUMPECTOMY Right    R side benign   Family History  Problem Relation Age of Onset  . Hypertension Father   . Stroke Father        hemorrhagic  . Heart disease Father   . Heart attack Father 67  . COPD Sister   . Cancer Sister        lung secondary to tobacco use  . Fibroids Sister        uterine  . Cancer Cousin        breast  . Alcohol abuse Mother    Social History   Socioeconomic  History  . Marital status: Legally Separated    Spouse name: None  . Number of children: None  . Years of education: None  . Highest education level: None  Social Needs  . Financial resource strain: None  . Food insecurity - worry: None  . Food insecurity - inability: None  . Transportation needs - medical: None  . Transportation needs - non-medical: None  Occupational History  . None  Tobacco Use  . Smoking status: Former Smoker    Packs/day: 1.50    Years: 30.00    Pack years: 45.00    Types: Cigarettes    Last attempt to quit: 2008    Years since quitting: 11.0  . Smokeless tobacco: Never Used  Substance and Sexual Activity  . Alcohol use: No  . Drug use: No    Comment: Clean since 1993; previous crack-cocaine  . Sexual activity: None  Other Topics Concern  . None  Social  History Narrative  . None   Outpatient Encounter Medications as of 10/21/2017  Medication Sig  . valsartan (DIOVAN) 160 MG tablet Take 1 tablet (160 mg total) by mouth daily.  Marland Kitchen aspirin EC 81 MG tablet Take 81 mg by mouth daily.   No facility-administered encounter medications on file as of 10/21/2017.     Activities of Daily Living No flowsheet data found.  Patient Care Team: Rage Beever, Charlene Brooke, NP as PCP - General (Internal Medicine)    Assessment:   This is a routine wellness examination for Phyllis Johnson.  Fall Risk Fall Risk  10/21/2017 10/21/2017  Falls in the past year? No Yes  Number falls in past yr: - 1  Injury with Fall? - No   Is the patient's home free of loose throw rugs in walkways, pet beds, electrical cords, etc?   yes      Grab bars in the bathroom? no      Handrails on the stairs?   no      Adequate lighting?   yes  Depression Screen PHQ 2/9 Scores 10/21/2017 10/21/2017  PHQ - 2 Score 0 0     Cognitive Function MMSE - Mini Mental State Exam 10/21/2017  Orientation to time 5  Orientation to Place 5  Registration 3  Attention/ Calculation 5  Recall 3  Language- name 2 objects 2  Language- repeat 1  Language- follow 3 step command 3  Language- read & follow direction 1  Write a sentence 1  Copy design 1  Total score 30   Screening Tests Health Maintenance  Topic Date Due  . HIV Screening  01/12/1968  . INFLUENZA VACCINE  06/23/2018 (Originally 05/11/2017)  . TETANUS/TDAP  10/11/2018 (Originally 01/12/1972)  . MAMMOGRAM  02/22/2019  . PAP SMEAR  09/30/2019  . COLONOSCOPY  10/13/2020  . Hepatitis C Screening  Completed    Cancer Screenings: Lung: Low Dose CT Chest recommended if Age 78-80 years, 30 pack-year currently smoking OR have quit w/in 15years. Patient does not qualify. Breast:  Up to date on Mammogram? Yes   Up to date of Bone Density/Dexa? No dexa scan due in 89months Colonoscopy: uptodate  Additional Screenings: Hepatitis C Screening:  done     Plan:     These are the goals we discussed: Schedule Dexa scan for osteoporosis screen after 01/11/2018. Schedule appt for eye exam and dental cleaning.  Entered referral for ophthalmology and dexa scan.  Continue regular exercise, healthy diet, and Brain stimulating activities.  I have personally reviewed and noted the  following in the patient's chart:   . Medical and social history . Use of alcohol, tobacco or illicit drugs  . Current medications and supplements . Functional ability and status . Nutritional status . Physical activity . Advanced directives . List of other physicians . Hospitalizations, surgeries, and ER visits in previous 12 months . Vitals . Screenings to include cognitive, depression, and falls . Referrals and appointments  In addition, I have reviewed and discussed with patient certain preventive protocols, quality metrics, and best practice recommendations. A written personalized care plan for preventive services as well as general preventive health recommendations were provided to patient.    Wilfred Lacy, NP  10/21/2017

## 2017-10-21 NOTE — Patient Instructions (Signed)
  Phyllis Johnson , Thank you for taking time to come for your Medicare Wellness Visit. I appreciate your ongoing commitment to your health goals. Please review the following plan we discussed and let me know if I can assist you in the future.   These are the goals we discussed: Schedule Dexa scan for osteoporosis screen after 01/11/2018. Schedule appt for eye exam and dental cleaning.  Continue regular exercise, healthy diet, and Brain stimulating activities.  This is a list of the screening recommended for you and due dates:  Health Maintenance  Topic Date Due  . HIV Screening  01/12/1968  . Flu Shot  06/23/2018*  . Tetanus Vaccine  10/11/2018*  . Mammogram  02/22/2019  . Pap Smear  09/30/2019  . Colon Cancer Screening  10/13/2020  .  Hepatitis C: One time screening is recommended by Center for Disease Control  (CDC) for  adults born from [birth year] through 1963.   Completed  *Topic was postponed. The date shown is not the original due date.

## 2017-10-24 ENCOUNTER — Encounter: Payer: Self-pay | Admitting: Nurse Practitioner

## 2017-11-04 DIAGNOSIS — H5203 Hypermetropia, bilateral: Secondary | ICD-10-CM | POA: Diagnosis not present

## 2017-11-04 DIAGNOSIS — H524 Presbyopia: Secondary | ICD-10-CM | POA: Diagnosis not present

## 2017-11-04 DIAGNOSIS — H33311 Horseshoe tear of retina without detachment, right eye: Secondary | ICD-10-CM | POA: Diagnosis not present

## 2017-11-04 DIAGNOSIS — H35413 Lattice degeneration of retina, bilateral: Secondary | ICD-10-CM | POA: Diagnosis not present

## 2017-11-04 DIAGNOSIS — H2513 Age-related nuclear cataract, bilateral: Secondary | ICD-10-CM | POA: Diagnosis not present

## 2017-11-11 DIAGNOSIS — H33311 Horseshoe tear of retina without detachment, right eye: Secondary | ICD-10-CM | POA: Diagnosis not present

## 2017-11-11 DIAGNOSIS — H2513 Age-related nuclear cataract, bilateral: Secondary | ICD-10-CM | POA: Diagnosis not present

## 2017-11-11 DIAGNOSIS — H43813 Vitreous degeneration, bilateral: Secondary | ICD-10-CM | POA: Diagnosis not present

## 2017-11-11 DIAGNOSIS — H35413 Lattice degeneration of retina, bilateral: Secondary | ICD-10-CM | POA: Diagnosis not present

## 2017-11-15 DIAGNOSIS — H33311 Horseshoe tear of retina without detachment, right eye: Secondary | ICD-10-CM | POA: Diagnosis not present

## 2017-12-07 ENCOUNTER — Telehealth: Payer: Self-pay | Admitting: Nurse Practitioner

## 2017-12-07 DIAGNOSIS — I10 Essential (primary) hypertension: Secondary | ICD-10-CM

## 2017-12-07 NOTE — Telephone Encounter (Signed)
Copied from [place]. Topic: Quick Communication - Rx Refill/Question >> Dec 07, 2017 12:39 PM Waylan Rocher, Lumin L wrote: Medication:  valsartan (DIOVAN) 160 MG tablet  Has the patient contacted their pharmacy? Yes.    (Agent: If no, request that the patient contact the pharmacy for the refill.)  Preferred Pharmacy (with phone number or street name): [id number] [address], [place]. [place] [address] Phone: [phone] Fax: [phone]  Agent: Please be advised that RX refills may take up to 3 business days. We ask that you follow-up with your pharmacy.    Patient has 3 days left.

## 2017-12-08 MED ORDER — VALSARTAN 160 MG PO TABS: 160.0000 mg | ORAL_TABLET | Freq: Every day | ORAL | 1 refills | Status: DC

## 2018-01-13 ENCOUNTER — Ambulatory Visit (INDEPENDENT_AMBULATORY_CARE_PROVIDER_SITE_OTHER): Payer: Medicare Other | Admitting: Nurse Practitioner

## 2018-01-13 ENCOUNTER — Encounter: Payer: Self-pay | Admitting: Nurse Practitioner

## 2018-01-13 VITALS — BP 140/92 | HR 62 | Temp 98.2°F | Ht 59.0 in | Wt 150.0 lb

## 2018-01-13 DIAGNOSIS — Z136 Encounter for screening for cardiovascular disorders: Secondary | ICD-10-CM | POA: Diagnosis not present

## 2018-01-13 DIAGNOSIS — Z13220 Encounter for screening for lipoid disorders: Secondary | ICD-10-CM | POA: Diagnosis not present

## 2018-01-13 DIAGNOSIS — I10 Essential (primary) hypertension: Secondary | ICD-10-CM | POA: Diagnosis not present

## 2018-01-13 LAB — COMPREHENSIVE METABOLIC PANEL
ALK PHOS: 108 U/L (ref 39–117)
ALT: 14 U/L (ref 0–35)
AST: 16 U/L (ref 0–37)
Albumin: 4.4 g/dL (ref 3.5–5.2)
BILIRUBIN TOTAL: 0.6 mg/dL (ref 0.2–1.2)
BUN: 9 mg/dL (ref 6–23)
CALCIUM: 10.3 mg/dL (ref 8.4–10.5)
CO2: 29 meq/L (ref 19–32)
Chloride: 103 mEq/L (ref 96–112)
Creatinine, Ser: 0.8 mg/dL (ref 0.40–1.20)
GFR: 92.58 mL/min (ref >60.00)
Glucose, Bld: 110 mg/dL — ABNORMAL HIGH (ref 70–99)
POTASSIUM: 5.2 meq/L — AB (ref 3.5–5.1)
Sodium: 140 mEq/L (ref 135–145)
TOTAL PROTEIN: 7 g/dL (ref 6.0–8.3)

## 2018-01-13 LAB — LIPID PANEL
CHOL/HDL RATIO: 2
Cholesterol: 191 mg/dL (ref 0–200)
HDL: 82 mg/dL (ref >39.00)
LDL CALC: 96 mg/dL (ref 0–99)
NONHDL: 109.07
TRIGLYCERIDES: 65 mg/dL (ref 0.0–149.0)
VLDL: 13 mg/dL (ref 0.0–40.0)

## 2018-01-13 LAB — CBC
HEMATOCRIT: 43.3 % (ref 36.0–46.0)
Hemoglobin: 14.5 g/dL (ref 12.0–15.0)
MCHC: 33.6 g/dL (ref 30.0–36.0)
MCV: 90.7 fl (ref 78.0–100.0)
PLATELETS: 335 10*3/uL (ref 150.0–400.0)
RBC: 4.77 Mil/uL (ref 3.87–5.11)
RDW: 13.6 % (ref 11.5–15.5)
WBC: 4.4 10*3/uL (ref 4.0–10.5)

## 2018-01-13 NOTE — Progress Notes (Signed)
Subjective:  Patient ID: Phyllis Johnson, female    DOB: 03-17-1951  Age: 67 y.o. MRN: [id number]  CC: Follow-up (F/U with HTN. Fasting for labs.)  HPI  HTN: Stable with losartan. Reports feeling stressed this morning due to conflict at home with daughter. BP Readings from Last 3 Encounters:  01/13/18 (!) 140/92  10/21/17 126/90  03/29/17 (!) 142/100   Anxiety: Daughter moved in with 22yrs old grandson. Reports daughter has erratic behavior and neglecting her responsibility as mother. She thinks her daughter is using an illicit drug (possibly heroin, marijuna and ETOH) She is not sure how to handle situation at this time. She has reached out to her church friends and pastor for assistance. States "this is getting to be too much. Something has to give. I will just have to take it one day at a time"  Outpatient Medications Prior to Visit  Medication Sig Dispense Refill  . aspirin EC 81 MG tablet Take 81 mg by mouth daily.    . valsartan (DIOVAN) 160 MG tablet Take 1 tablet (160 mg total) by mouth daily. 90 tablet 1   No facility-administered medications prior to visit.     ROS See HPI  Objective:  BP (!) 140/92 (BP Location: Left Arm, Patient Position: Sitting, Cuff Size: Normal)   Pulse 62   Temp 98.2 F (36.8 C) (Oral)   Ht 4\' 11"  (1.499 m)   Wt 150 lb (68 kg)   SpO2 96%   BMI 30.30 kg/m   BP Readings from Last 3 Encounters:  01/13/18 (!) 140/92  10/21/17 126/90  03/29/17 (!) 142/100    Wt Readings from Last 3 Encounters:  01/13/18 150 lb (68 kg)  10/21/17 152 lb (68.9 kg)  03/29/17 147 lb (66.7 kg)    Physical Exam  Lab Results  Component Value Date   WBC 3.2 (L) 11/05/2016   HGB 13.9 11/05/2016   HCT 41.5 11/05/2016   PLT 276.0 11/05/2016   GLUCOSE 92 12/06/2016   CHOL 206 (H) 09/29/2016   TRIG 92.0 09/29/2016   HDL 76.90 09/29/2016   LDLCALC 111 (H) 09/29/2016   ALT 23 11/05/2016   AST 22 11/05/2016   NA 139 12/06/2016   K 4.3 12/06/2016   CL 104 12/06/2016   CREATININE 0.82 12/06/2016   BUN 8 12/06/2016   CO2 26 12/06/2016   TSH 0.53 11/05/2016    Mm Screening Breast Tomo Bilateral  Result Date: 02/22/2017 CLINICAL DATA:  Screening. EXAM: 2D DIGITAL SCREENING BILATERAL MAMMOGRAM WITH CAD AND ADJUNCT TOMO COMPARISON:  Previous exam(s). ACR Breast Density Category b: There are scattered areas of fibroglandular density. FINDINGS: There are no findings suspicious for malignancy. Images were processed with CAD. IMPRESSION: No mammographic evidence of malignancy. A result letter of this screening mammogram will be mailed directly to the patient. RECOMMENDATION: Screening mammogram in one year. (Code:SM-B-01Y) BI-RADS CATEGORY  1: Negative. Electronically Signed   By: Lajean Manes M.D.   On: 02/22/2017 11:15    Assessment & Plan:   Phyllis Johnson was seen today for follow-up.  Diagnoses and all orders for this visit:  HTN (hypertension), benign -     CBC -     Comprehensive metabolic panel  Encounter for lipid screening for cardiovascular disease -     Lipid panel   I am having Phyllis Johnson maintain her aspirin EC and valsartan.  No orders of the defined types were placed in this encounter.   Follow-up: Return if symptoms worsen or fail to  improve.  Wilfred Lacy, NP

## 2018-01-13 NOTE — Patient Instructions (Addendum)
Advised her to look into supports groups for parents and children of substance abuse ([employer] and Al-Anon).  Local meeting groups: [place] ([address]) and [place] ([address])  Addiction and the Family What is addiction? Addiction is a complex disease of the brain. It causes an uncontrollable (compulsive) need for a substance. You can be addicted to alcohol or illegal drugs or to prescription medicines, such as painkillers. Addiction can also be a behavior, like gambling or shopping. The need for the drug or activity can become so strong that you think about it all the time. You can also become physically dependent on a substance. Addiction can change the way your brain works. Because of these changes, getting more of whatever you are addicted to becomes the most important thing to you and feels better than other activities or relationships. Addiction can lead to changes in health, behavior, emotions, relationships, and choices that affect you and everyone around you. What are common signs of addiction? Addiction can cause behavioral and emotional signs, as well as physical signs. Behavioral signs of addiction may include:  Having an intense craving for whatever you are addicted to.  Always thinking about your addiction.  Planning your life around your addiction.  Being unable to stop using a substance or participating in a behavior.  Devoting more time to the addiction. This might mean you no longer go to school or work or spend time with people you enjoy.  Having an increasing need for money. An addiction might make you ask people for unusual loans or steal items to sell.  Having exaggerated emotional responses to difficult situations. These may include: ? Feeling anxious or stressed out. ? Extreme irritability. ? Aggression. ? Lying.  Continuing with the addiction even after it has caused a bad outcome, such as: ? Poor health. ? Damaged  relationships. ? Money loss. ? Job loss. ? Getting hurt or arrested.  Having trouble being realistic about the negative effects of addiction.  Physical signs of addiction may include:  Bloodshot eyes.  Nosebleeds.  Poor hygiene.  Changing sleep patterns.  Shakes and tremors.  Slurred speech.  Confusion.  Unconsciousness.  How can addiction affect family members? Addiction affects everyone in a family. It touches all aspects of family life, including finances, communication, work, school, and free time. Children of an addict might have:  Birth defects, if the mother was addicted during pregnancy.  Physical, emotional, and behavioral problems.  Trouble in school.  Injury from violence, abuse, or accidents.  Problems because of neglect.  A greater risk for addiction later in life.  Parents of an addict might experience:  Confusion.  Worry.  Guilt.  Fear.  Sadness.  A desire to make the addiction seem less of a problem than it is.  Financial strain.  Feeling isolated from family and friends.  Physical health changes from stress or from violent confrontations.  Brothers, sisters, and extended family members of an addict might experience:  Worry.  Anger.  Fear.  Resentment.  Confusion.  A desire to hide the addiction and its impact.  Financial strain.  How do I know if treatment for addiction is needed? Addiction is a progressive disease. Without treatment, addiction can get worse. Living with addiction puts you at higher risk for injury, poor health, lost employment, loss of money, and even death. You might need treatment for addiction if:  You have tried to stop or cut down, and you cannot.  Your  addiction is causing physical health problems.  You find it annoying that your friends and family are concerned about your alcohol or substance use.  You feel guilty about substance abuse or a behavior.  You have lied or tried to hide your  addiction.  You need a particular substance to start your day or calm down.  You are getting in trouble at school, work, home, or with the police.  You have done something illegal to support your addiction.  You are running out of money because of your addiction.  You have no time for anything other than your addiction.  What types of treatment options are available?  Treatment for the addict The treatment program that is right for you will depend on many factors, including the type of addiction you have. Treatment programs can be outpatient or inpatient. In an outpatient program, you live at home and go to work but also go to a clinic for treatment. With an inpatient program, you sleep and live at the program facility during treatment. After treatment, you might need a plan for support during recovery. Other treatment options include:  Medicine. ? Some addictions may be treated with prescription medicines. ? You might also need medicine to treat anxiety or depression.  Counseling and behavior therapy. Therapy can help individuals and families behave and relate more effectively.  Support groups. Confidential group therapy, such as twelve-step program, can help individuals and families during treatment and recovery.  Treatment for family members Addiction affects the entire family. Ask your health care provider or counselor to recommend resources for family members. You can call Nar-Anon at 613-463-0739 or visit their website at http://www.nar-anon.org/find-a-group/ Where else can I get help?  Ask your health care provider for help finding addiction treatment. These discussions are confidential.  The [employer] on Alcoholism and Drug Dependence (NCADD). This group has information on treatment centers and programs for people who have an addiction and for family members. ? Their telephone number is 1-800-NCA-CALL. ? Their website is  https://ncadd.org/affiliate-network/find-an-affiliate  The Substance Abuse and Mental Health Services Administration [facility]). This group will help you find publicly funded treatment centers, help hotlines, and counseling services near you. ? Their telephone number is 1-800-662-HELP (4357). ? Their website is www.findtreatment.Samed[employer].com.cy This information is not intended to replace advice given to you by your health care provider. Make sure you discuss any questions you have with your health care provider. Document Released: 06/02/2004 Document Revised: 09/13/2016 Document Reviewed: 12/17/2013 Elsevier Interactive Patient Education  2017 [employer].

## 2018-01-31 ENCOUNTER — Other Ambulatory Visit (HOSPITAL_BASED_OUTPATIENT_CLINIC_OR_DEPARTMENT_OTHER): Payer: Medicare Other

## 2018-01-31 ENCOUNTER — Inpatient Hospital Stay: Admission: RE | Admit: 2018-01-31 | Payer: Medicare Other | Source: Ambulatory Visit

## 2018-03-03 ENCOUNTER — Other Ambulatory Visit: Payer: Self-pay

## 2018-03-03 ENCOUNTER — Emergency Department (HOSPITAL_COMMUNITY): Payer: Medicare Other

## 2018-03-03 ENCOUNTER — Emergency Department (HOSPITAL_COMMUNITY)
Admission: EM | Admit: 2018-03-03 | Discharge: 2018-03-03 | Disposition: A | Discharge status: Home / Self Care | Payer: Medicare Other | Attending: Emergency Medicine | Admitting: Emergency Medicine

## 2018-03-03 ENCOUNTER — Encounter (HOSPITAL_COMMUNITY): Payer: Self-pay | Admitting: Emergency Medicine

## 2018-03-03 DIAGNOSIS — Z87891 Personal history of nicotine dependence: Secondary | ICD-10-CM | POA: Diagnosis not present

## 2018-03-03 DIAGNOSIS — R531 Weakness: Secondary | ICD-10-CM | POA: Insufficient documentation

## 2018-03-03 DIAGNOSIS — R05 Cough: Secondary | ICD-10-CM | POA: Insufficient documentation

## 2018-03-03 DIAGNOSIS — Z7982 Long term (current) use of aspirin: Secondary | ICD-10-CM | POA: Diagnosis not present

## 2018-03-03 DIAGNOSIS — I10 Essential (primary) hypertension: Secondary | ICD-10-CM | POA: Insufficient documentation

## 2018-03-03 DIAGNOSIS — Z79899 Other long term (current) drug therapy: Secondary | ICD-10-CM | POA: Insufficient documentation

## 2018-03-03 DIAGNOSIS — H538 Other visual disturbances: Secondary | ICD-10-CM | POA: Insufficient documentation

## 2018-03-03 DIAGNOSIS — R42 Dizziness and giddiness: Secondary | ICD-10-CM | POA: Diagnosis present

## 2018-03-03 LAB — I-STAT CHEM 8, ED
BUN: 6 mg/dL (ref 6–20)
CHLORIDE: 106 mmol/L (ref 101–111)
CREATININE: 0.6 mg/dL (ref 0.44–1.00)
Calcium, Ion: 1.11 mmol/L — ABNORMAL LOW (ref 1.15–1.40)
Glucose, Bld: 111 mg/dL — ABNORMAL HIGH (ref 65–99)
HEMATOCRIT: 40 % (ref 36.0–46.0)
Hemoglobin: 13.6 g/dL (ref 12.0–15.0)
POTASSIUM: 3.7 mmol/L (ref 3.5–5.1)
Sodium: 139 mmol/L (ref 135–145)
TCO2: 21 mmol/L — ABNORMAL LOW (ref 22–32)

## 2018-03-03 NOTE — ED Triage Notes (Addendum)
Patient arrived from home via EMS. Reports "real sudden dizziness and blurry vision". She states that she sat down and after a few minutes, she felt like better She also report left eye surgey- 36months ago Reports recent stressor in her life Patient is A&O X4 Denies pain at this time

## 2018-03-03 NOTE — ED Notes (Signed)
D/c reviewed with patient 

## 2018-03-03 NOTE — Discharge Instructions (Addendum)
Call Dr Wylene Men office today orTuesday,03/07/18 to schedule an office visit.Your blood pressure should be rechecked within the next 3 weeks. Today's was elevated at 151/95. Take your medications as prescribed.it is okay to take your blood pressure medicine when you arrive home today.return if concern for any reason

## 2018-03-03 NOTE — ED Notes (Signed)
Patient returned from XRay

## 2018-03-03 NOTE — ED Notes (Signed)
Pt denies pain and dizziness at this time

## 2018-03-03 NOTE — ED Provider Notes (Signed)
[facility] EMERGENCY DEPARTMENT Provider Note   CSN: [id number] Arrival date & time: 03/03/18  2703     History   Chief Complaint Chief Complaint  Patient presents with  . Dizziness    Blurry vision    HPI Sherice Ijames is a 67 y.o. female.patient reports that she became lightheaded and developed changes in her vision "like everything was breaking up" in bilateral eyes this morning at 7:45 AM while standing. Symptoms lasted 10 seconds, resolve spontaneously without treatment. Denies headache denies chest pain denies shortness of breath no focal numbness or weakness. She is presently asymptomatic. Brought by EMS. He did not fall to the ground.  HPI  Past Medical History:  Diagnosis Date  . Allergy   . Anxiety    under control with prayer and exercise.  . Arrhythmia    "irregular heart beat" during stress test in [place]  . Chronic bronchitis ([place])   . Depression    in remission with counselling  . Hypertension     Patient Active Problem List   Diagnosis Date Noted  . Atypical chest pain 11/11/2016  . Abnormal EKG 11/11/2016  . Situational anxiety 09/29/2016  . HTN (hypertension), benign 08/31/2016  . Chronic bronchitis with acute exacerbation ([place]) 08/31/2016  . Chronic allergic rhinitis 08/31/2016    Past Surgical History:  Procedure Laterality Date  . ABDOMINAL HYSTERECTOMY     with oophrectomy, secondary to large uterine fibriods  . BREAST LUMPECTOMY Right    R side benign     OB History   None      Home Medications    Prior to Admission medications   Medication Sig Start Date End Date Taking? Authorizing Provider  aspirin EC 81 MG tablet Take 81 mg by mouth daily.    [provider]  valsartan (DIOVAN) 160 MG tablet Take 1 tablet (160 mg total) by mouth daily. 12/08/17   Nche, Charlene Brooke, NP    Family History Family History  Problem Relation Age of Onset  . Hypertension Father   . Stroke Father        hemorrhagic  .  Heart disease Father   . Heart attack Father 54  . COPD Sister   . Cancer Sister        lung secondary to tobacco use  . Fibroids Sister        uterine  . Cancer Cousin        breast  . Alcohol abuse Mother     Social History Social History   Tobacco Use  . Smoking status: Former Smoker    Packs/day: 1.50    Years: 30.00    Pack years: 45.00    Types: Cigarettes    Last attempt to quit: 2008    Years since quitting: 11.4  . Smokeless tobacco: Never Used  Substance Use Topics  . Alcohol use: No  . Drug use: No    Comment: Clean since 1993; previous crack-cocaine     Allergies   Patient has no known allergies.   Review of Systems Review of Systems  Constitutional: Negative.   HENT: Negative.   Eyes: Positive for visual disturbance.  Respiratory: Positive for cough.        Cough for several months, resolved a few weeks ago  Cardiovascular: Negative.   Gastrointestinal: Negative.   Musculoskeletal: Negative.   Skin: Negative.   Neurological: Positive for light-headedness.  Psychiatric/Behavioral: Negative.   All other systems reviewed and are negative.  Physical Exam Updated Vital Signs BP (!) 157/97   Pulse 60   Temp 97.9 F (36.6 C) (Oral)   Resp 16   Ht 4\' 11"  (1.499 m)   Wt 68 kg (150 lb)   SpO2 100%   BMI 30.30 kg/m   Physical Exam  Constitutional: She is oriented to person, place, and time. She appears well-developed and well-nourished.  HENT:  Head: Normocephalic and atraumatic.  Eyes: Pupils are equal, round, and reactive to light. Conjunctivae are normal.  Neck: Neck supple. No tracheal deviation present. No thyromegaly present.  Cardiovascular: Normal rate and regular rhythm.  No murmur heard. Pulmonary/Chest: Effort normal and breath sounds normal.  Abdominal: Soft. Bowel sounds are normal. She exhibits no distension. There is no tenderness.  Musculoskeletal: Normal range of motion. She exhibits no edema or tenderness.    Neurological: She is alert and oriented to person, place, and time. No cranial nerve deficit. Coordination normal.  Gait normal. Not lightheaded on standing  Skin: Skin is warm and dry. No rash noted.  Psychiatric: She has a normal mood and affect.  Nursing note and vitals reviewed.    ED Treatments / Results  Labs (all labs ordered are listed, but only abnormal results are displayed) Labs Reviewed  I-STAT CHEM 8, ED    EKG EKG Interpretation  Date/Time:  Friday Mar 03 2018 08:46:24 EDT Ventricular Rate:  59 PR Interval:    QRS Duration: 88 QT Interval:  452 QTC Calculation: 448 R Axis:   -30 Text Interpretation:  Sinus rhythm Consider left atrial enlargement Left axis deviation Low voltage, precordial leads Abnormal R-wave progression, early transition Nonspecific T abnormalities, anterior leads Baseline wander in lead(s) II aVF No significant change since last tracing Confirmed by Orlie Dakin [phone]) on 03/03/2018 8:51:54 AM   Radiology No results found.  Procedures Procedures (including critical care time)  Medications Ordered in ED Medications - No data to display  Results for orders placed or performed during the hospital encounter of 03/03/18  I-stat chem 8, ed  Result Value Ref Range   Sodium 139 135 - 145 mmol/L   Potassium 3.7 3.5 - 5.1 mmol/L   Chloride 106 101 - 111 mmol/L   BUN 6 6 - 20 mg/dL   Creatinine, Ser 0.60 0.44 - 1.00 mg/dL   Glucose, Bld 111 (H) 65 - 99 mg/dL   Calcium, Ion 1.11 (L) 1.15 - 1.40 mmol/L   TCO2 21 (L) 22 - 32 mmol/L   Hemoglobin 13.6 12.0 - 15.0 g/dL   HCT 40.0 36.0 - 46.0 %   Dg Chest 2 View  Result Date: 03/03/2018 CLINICAL DATA:  Productive cough for 2 months EXAM: CHEST - 2 VIEW COMPARISON:  11/05/2016 FINDINGS: The heart size and mediastinal contours are within normal limits. Both lungs are clear. The visualized skeletal structures are unremarkable. IMPRESSION: No active cardiopulmonary disease. Electronically Signed    By: Kathreen Devoid   On: 03/03/2018 11:41   Initial Impression / Assessment and Plan / ED Course  I have reviewed the triage vital signs and the nursing notes.  Pertinent labs & imaging results that were available during my care of the patient were reviewed by me and considered in my medical decision making (see chart for details).     12 noon. Patient remains asymptomatic. No distress. Plan follow-up Dr.NCHE. Patient may need an event monitor.and blood pressure recheck 3 weeks  Final Clinical Impressions(s) / ED Diagnoses  Diagnosis #1transient weakness #2 elevated blood pressure Final  diagnoses:  None    ED Discharge Orders    None       Orlie Dakin, MD 03/03/18 1210

## 2018-03-03 NOTE — ED Notes (Signed)
ED Provider at bedside. 

## 2018-03-07 ENCOUNTER — Encounter: Payer: Self-pay | Admitting: Family Medicine

## 2018-03-07 ENCOUNTER — Ambulatory Visit (INDEPENDENT_AMBULATORY_CARE_PROVIDER_SITE_OTHER): Payer: Medicare Other | Admitting: Family Medicine

## 2018-03-07 VITALS — BP 128/80 | HR 70 | Temp 98.6°F | Ht 59.0 in | Wt 146.2 lb

## 2018-03-07 DIAGNOSIS — F439 Reaction to severe stress, unspecified: Secondary | ICD-10-CM

## 2018-03-07 HISTORY — DX: Reaction to severe stress, unspecified: F43.9

## 2018-03-07 NOTE — Progress Notes (Signed)
Subjective:  Patient ID: Phyllis Johnson, female    DOB: March 22, 1951  Age: 67 y.o. MRN: [id number]  CC: Hospitalization Follow-up   HPI Phyllis Johnson presents for follow-up status post ER visit 4 days ago.  She had been sitting at her breakfast table with her 4 year old grandson when she briefly lost her vision.  She was then taken to to the emergency room by EMS and the subsequent work-up was negative.  That note was reviewed.  Her blood pressure was mildly elevated at that time.  Her blood pressures typically in the 120s over 80 range.  Her grandson is been living with her since February of this year.  He was previously previously located in [place].  His mother her daughter is in the throes of addiction.  She is also HIV positive.  She is also living with the patient.  She comes into the home and stays gone for days at a time.  Her whereabouts are not accounted for.  Patient has no history of headaches or visual changes.  Recent dilated eye exam was normal.  She has a highly favorable lipid profile.  She does not smoke.  She overcame addiction herself almost 30 years ago.  Patient does not drink alcohol or smoke.  Outpatient Medications Prior to Visit  Medication Sig Dispense Refill  . valsartan (DIOVAN) 160 MG tablet Take 1 tablet (160 mg total) by mouth daily. 90 tablet 1  . aspirin EC 81 MG tablet Take 81 mg by mouth daily.     No facility-administered medications prior to visit.     ROS Review of Systems  Constitutional: Negative for chills, fatigue, fever and unexpected weight change.  HENT: Negative.   Eyes: Negative for photophobia and visual disturbance.  Respiratory: Negative.   Cardiovascular: Negative.   Gastrointestinal: Negative.   Genitourinary: Negative.   Musculoskeletal: Negative.   Skin: Negative.   Neurological: Negative for dizziness, seizures, weakness, light-headedness, numbness and headaches.  Hematological: Negative.   Psychiatric/Behavioral: Negative.      Objective:  BP 128/80   Pulse 70   Temp 98.6 F (37 C)   Ht 4\' 11"  (1.499 m)   Wt 146 lb 4 oz (66.3 kg)   SpO2 96%   BMI 29.54 kg/m   BP Readings from Last 3 Encounters:  03/07/18 128/80  03/03/18 (!) 151/95  01/13/18 (!) 140/92    Wt Readings from Last 3 Encounters:  03/07/18 146 lb 4 oz (66.3 kg)  03/03/18 150 lb (68 kg)  01/13/18 150 lb (68 kg)    Physical Exam  Constitutional: She is oriented to person, place, and time. She appears well-developed and well-nourished. No distress.  HENT:  Head: Normocephalic and atraumatic.  Right Ear: External ear normal.  Left Ear: External ear normal.  Nose: Nose normal.  Mouth/Throat: Oropharynx is clear and moist. No oropharyngeal exudate.  Eyes: Pupils are equal, round, and reactive to light. Conjunctivae and EOM are normal. Right eye exhibits no discharge. Left eye exhibits no discharge. No scleral icterus.  Neck: Neck supple. No JVD present. No tracheal deviation present. No thyromegaly present.  Cardiovascular: Normal rate, regular rhythm and normal heart sounds.  Pulmonary/Chest: Effort normal and breath sounds normal.  Lymphadenopathy:    She has no cervical adenopathy.  Neurological: She is alert and oriented to person, place, and time.  Skin: She is not diaphoretic.  Psychiatric: She has a normal mood and affect. Her behavior is normal. Thought content normal.    Lab Results  Component Value Date   WBC 4.4 01/13/2018   HGB 13.6 03/03/2018   HCT 40.0 03/03/2018   PLT 335.0 01/13/2018   GLUCOSE 111 (H) 03/03/2018   CHOL 191 01/13/2018   TRIG 65.0 01/13/2018   HDL 82.00 01/13/2018   LDLCALC 96 01/13/2018   ALT 14 01/13/2018   AST 16 01/13/2018   NA 139 03/03/2018   K 3.7 03/03/2018   CL 106 03/03/2018   CREATININE 0.60 03/03/2018   BUN 6 03/03/2018   CO2 29 01/13/2018   TSH 0.53 11/05/2016    Dg Chest 2 View  Result Date: 03/03/2018 CLINICAL DATA:  Productive cough for 2 months EXAM: CHEST - 2 VIEW  COMPARISON:  11/05/2016 FINDINGS: The heart size and mediastinal contours are within normal limits. Both lungs are clear. The visualized skeletal structures are unremarkable. IMPRESSION: No active cardiopulmonary disease. Electronically Signed   By: Kathreen Devoid   On: 03/03/2018 11:41    Assessment & Plan:   There are no diagnoses linked to this encounter. I have discontinued Neera Teng aspirin EC. I am also having her maintain her valsartan.  No orders of the defined types were placed in this encounter.  Encouraged patient to look into Al-Anon and Alateen for her grandson.  She will consider it.  Follow-up: No follow-ups on file.  Libby Maw, MD

## 2018-04-24 ENCOUNTER — Other Ambulatory Visit: Payer: Self-pay | Admitting: Nurse Practitioner

## 2018-04-24 DIAGNOSIS — Z1231 Encounter for screening mammogram for malignant neoplasm of breast: Secondary | ICD-10-CM

## 2018-04-27 ENCOUNTER — Ambulatory Visit
Admission: RE | Admit: 2018-04-27 | Discharge: 2018-04-27 | Disposition: A | Discharge status: Home / Self Care | Payer: Medicare Other | Source: Ambulatory Visit | Attending: Nurse Practitioner | Admitting: Nurse Practitioner

## 2018-04-27 DIAGNOSIS — Z1231 Encounter for screening mammogram for malignant neoplasm of breast: Secondary | ICD-10-CM

## 2018-05-09 ENCOUNTER — Other Ambulatory Visit: Payer: Self-pay

## 2018-05-09 DIAGNOSIS — I10 Essential (primary) hypertension: Secondary | ICD-10-CM

## 2018-05-09 MED ORDER — VALSARTAN 160 MG PO TABS: 160.0000 mg | ORAL_TABLET | Freq: Every day | ORAL | 0 refills | Status: DC

## 2018-05-11 IMAGING — DX DG CHEST 2V
2 series · 2 of 2 positions shown · non-contrast
Comparison: None.

CLINICAL DATA: Chest pain and HTN this morning (161/101)

EXAM:
CHEST  2 VIEW

[chest pa]
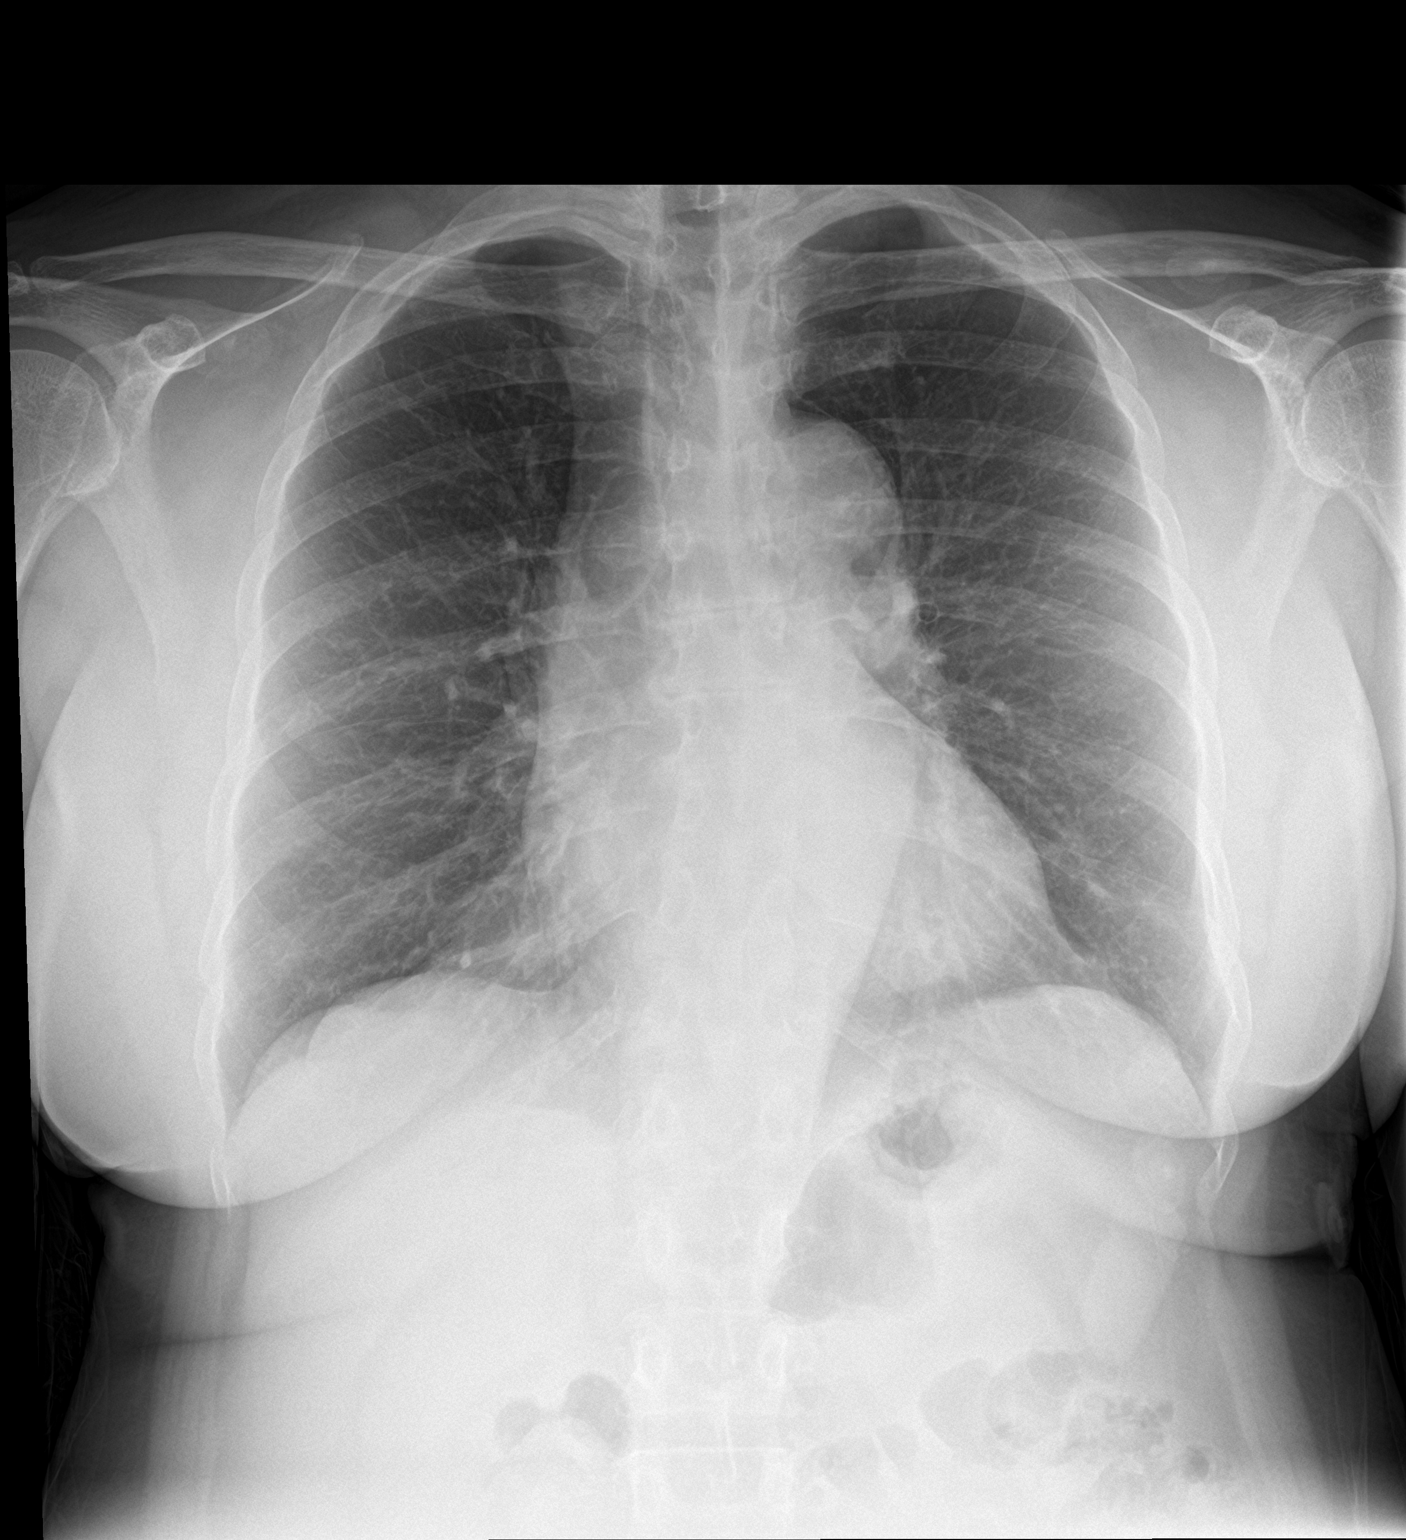

[chest lat]
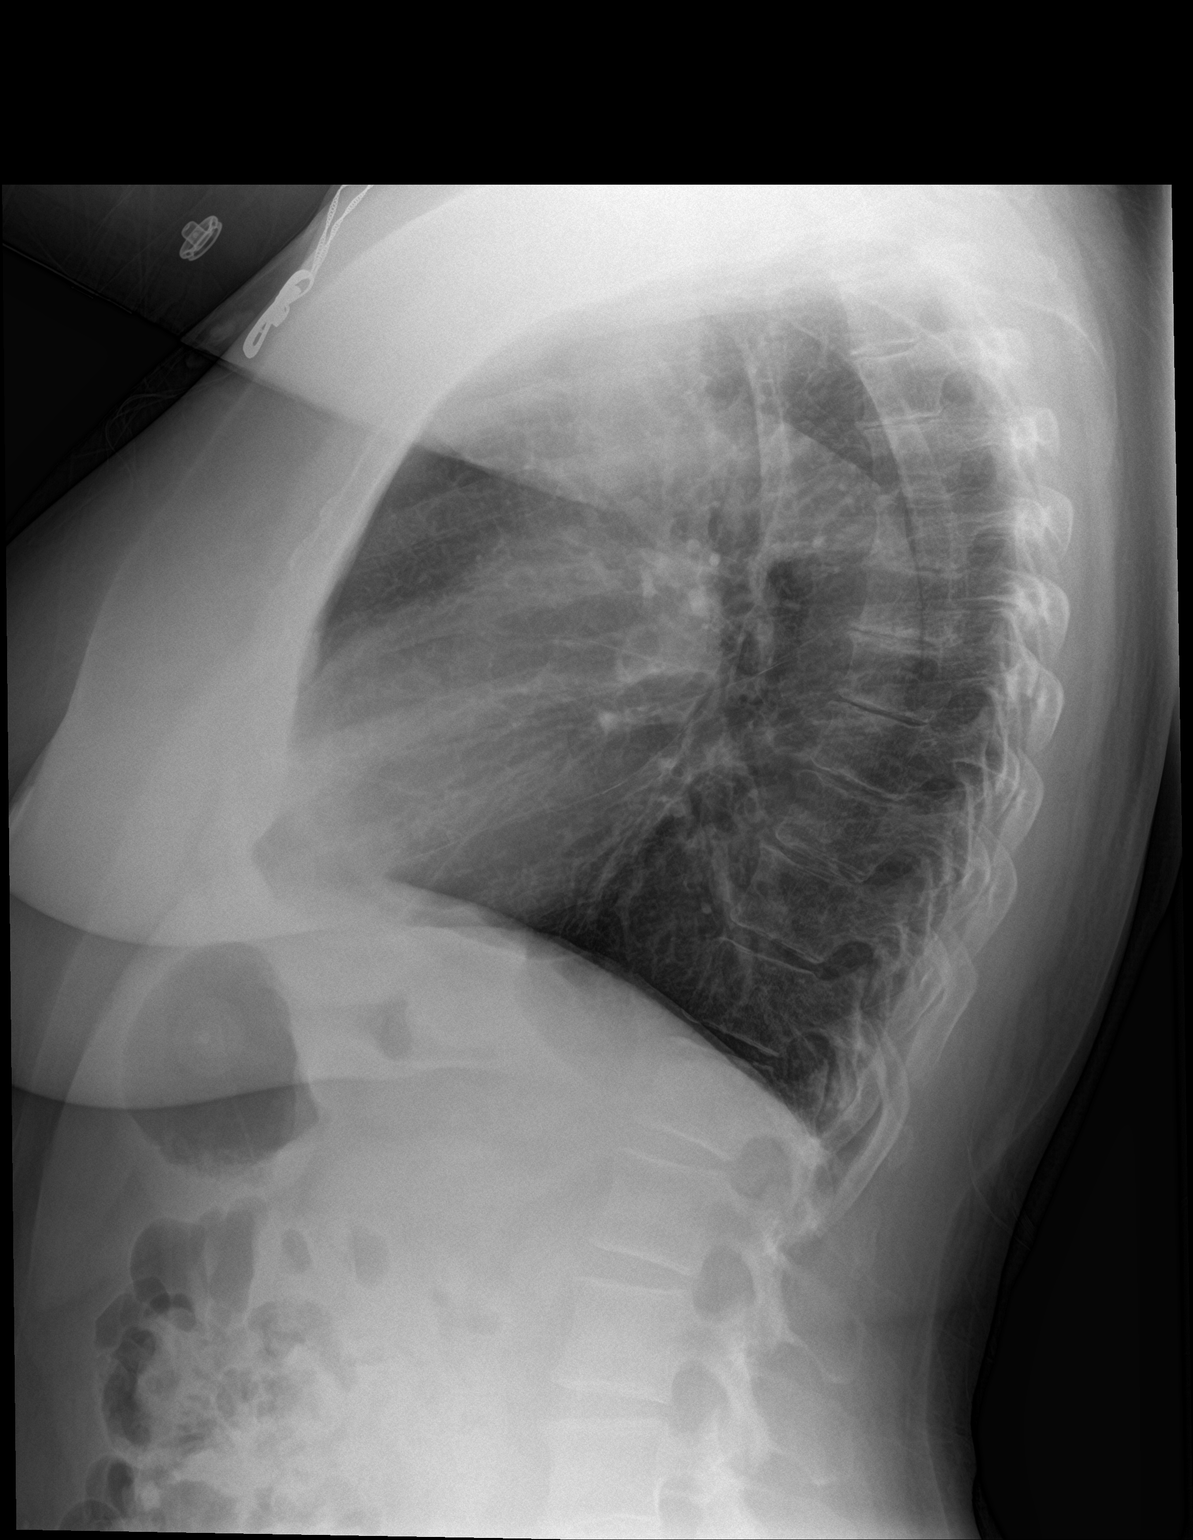

[2 of 2 positions shown; findings below may reference images not displayed]

FINDINGS: The heart size and mediastinal contours are within normal limits.
Both lungs are clear. The visualized skeletal structures are
unremarkable.
IMPRESSION: No active cardiopulmonary disease.

## 2018-06-03 ENCOUNTER — Other Ambulatory Visit: Payer: Self-pay | Admitting: Nurse Practitioner

## 2018-06-03 DIAGNOSIS — I10 Essential (primary) hypertension: Secondary | ICD-10-CM

## 2018-06-28 IMAGING — DX DG CHEST 2V
2 series · 2 of 2 positions shown · non-contrast
Comparison: PA and lateral chest x-ray September 18, 2016

CLINICAL DATA: Cough, shortness of breath, dizziness, orthostasis.

EXAM:
CHEST  2 VIEW

[chest pa]
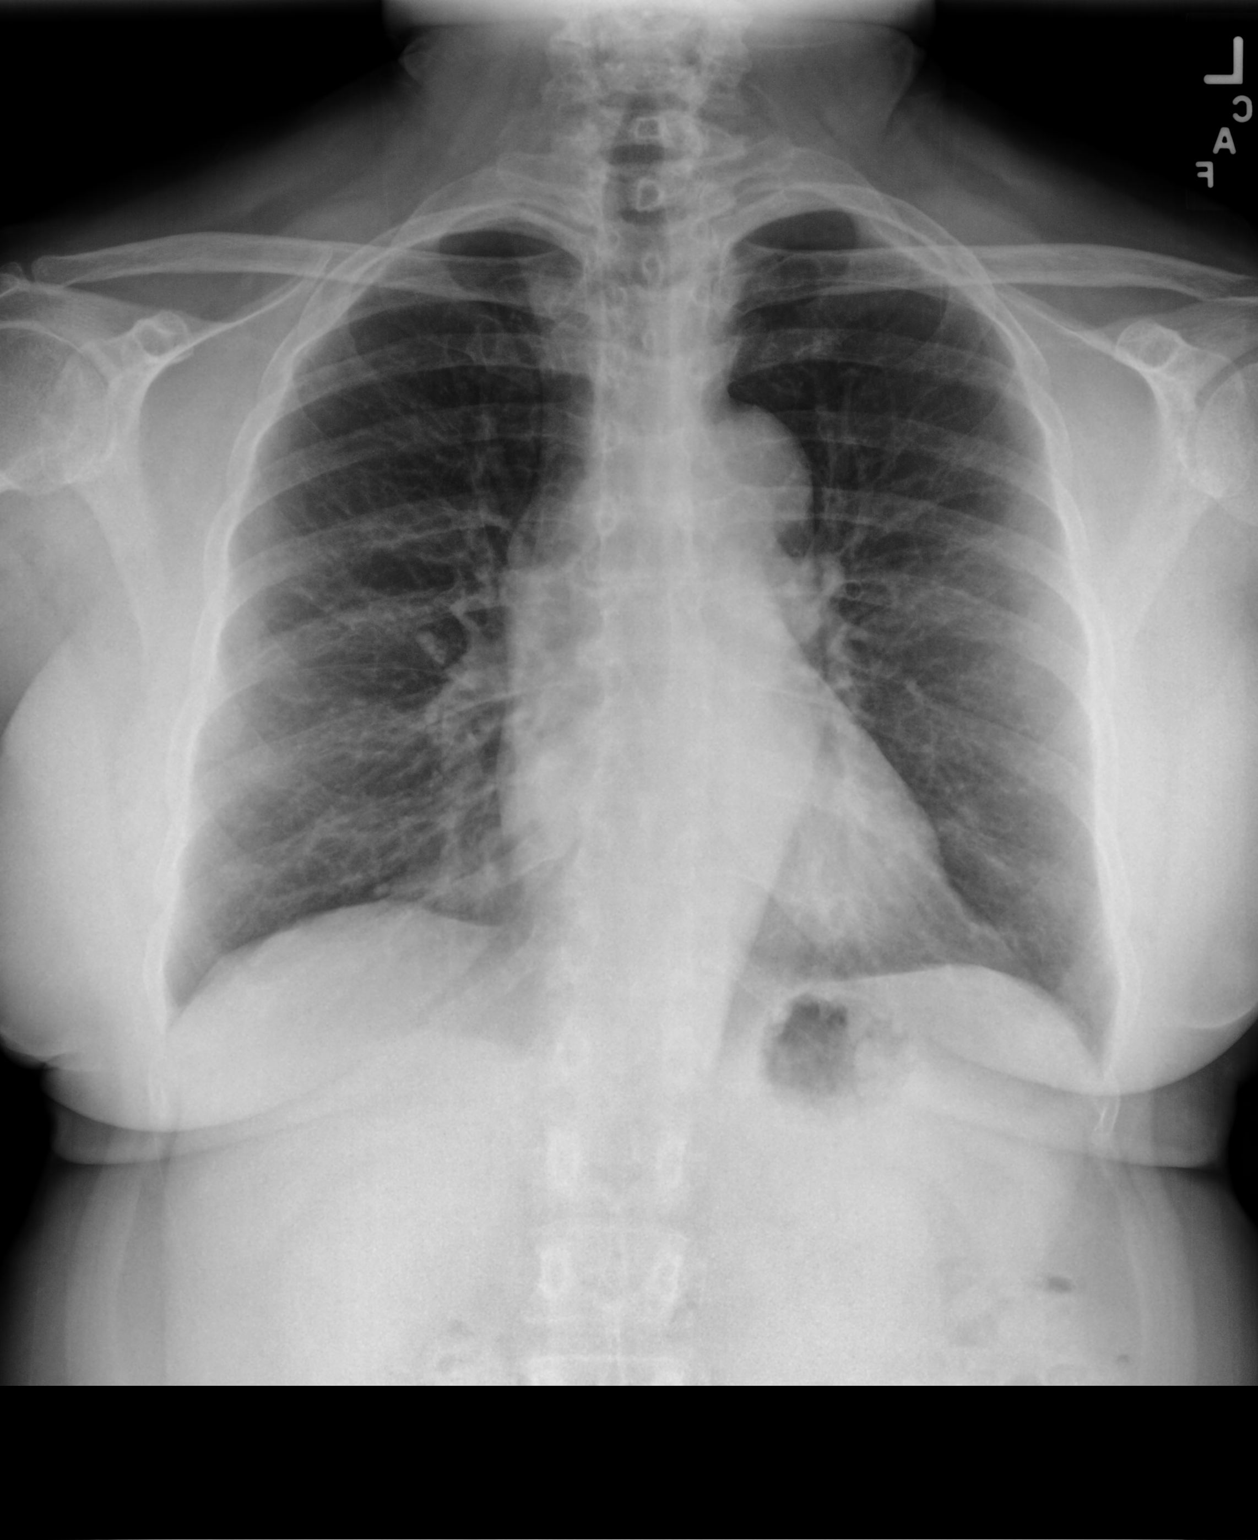

[chest lat]
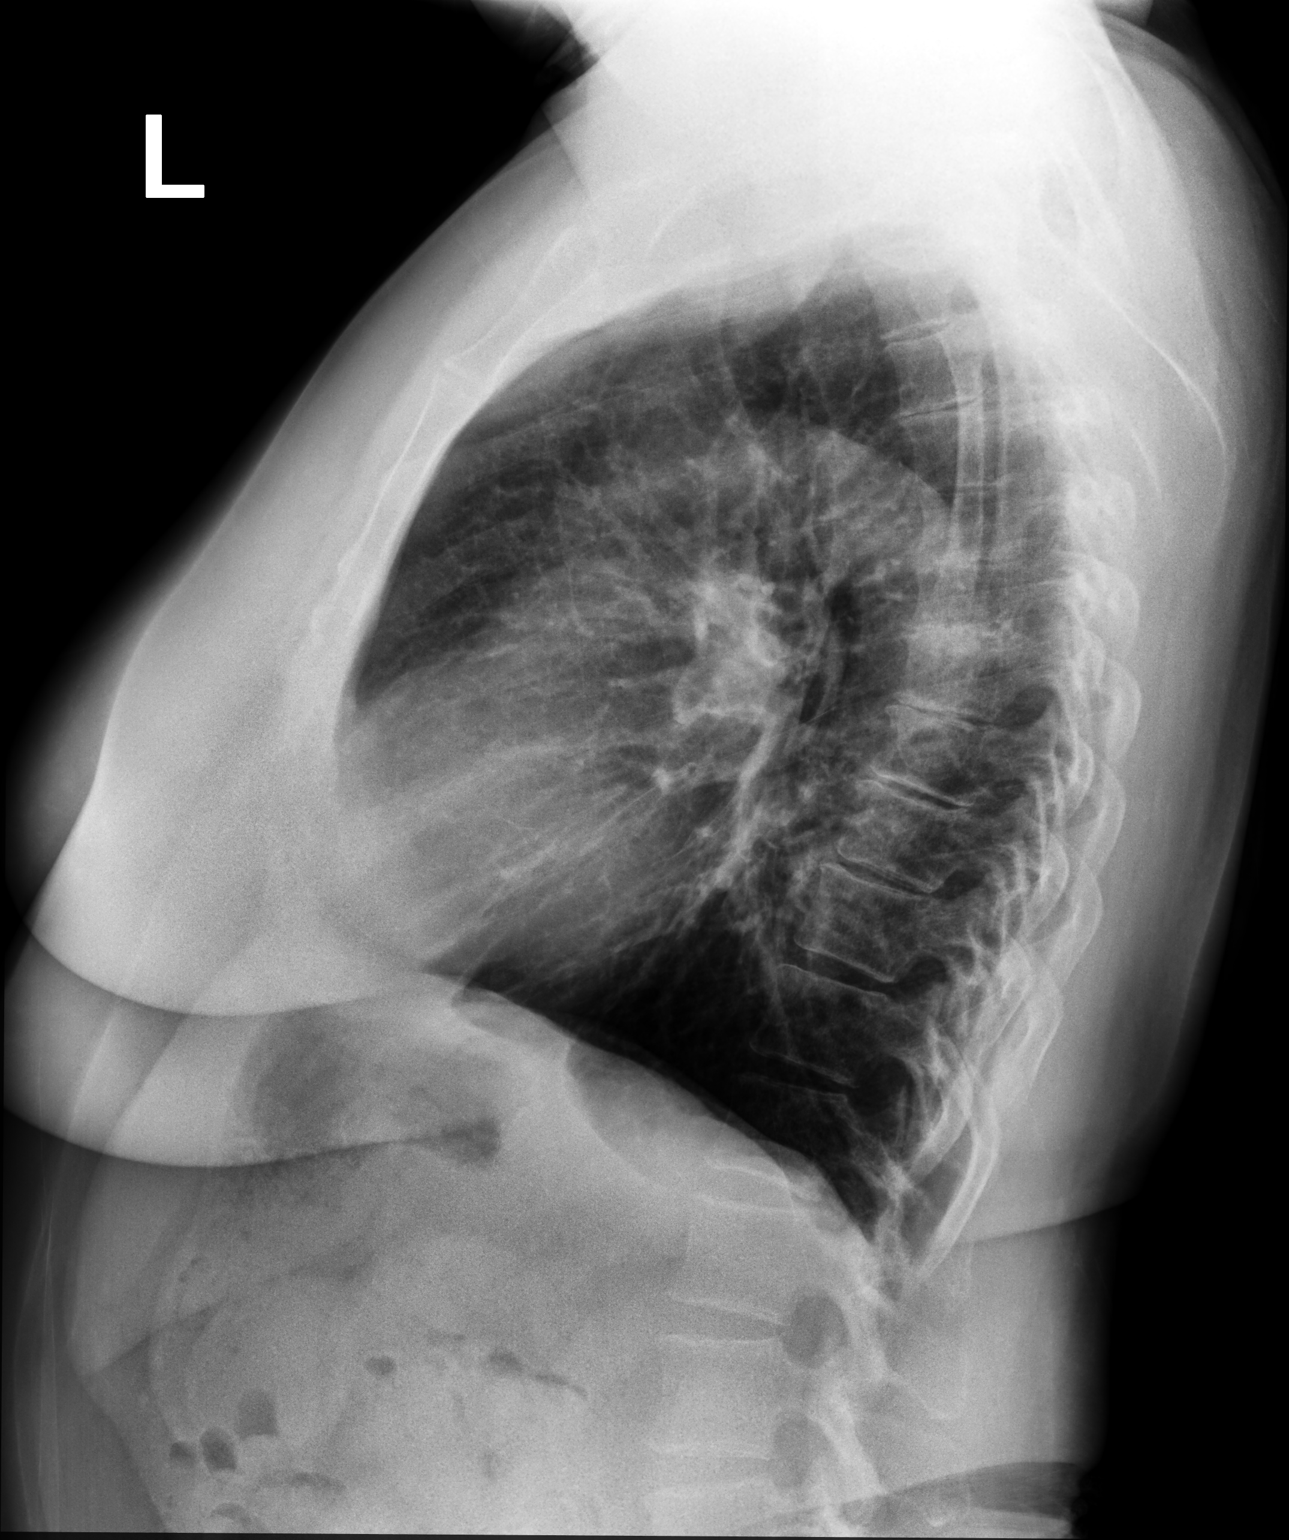

[2 of 2 positions shown; findings below may reference images not displayed]

FINDINGS: The lungs are mildly hyperinflated. There is no focal infiltrate.
There is no pleural effusion or pneumothorax. The heart and
pulmonary vascularity are normal. There is tortuosity of the
ascending and descending thoracic aorta. The bony thorax exhibits no
acute abnormality. There is multilevel degenerative disc disease of
the thoracic spine.
IMPRESSION: COPD. There is no pneumonia, CHF, nor other acute cardiopulmonary
disease.

Thoracic aortic atherosclerosis.

## 2018-08-25 ENCOUNTER — Other Ambulatory Visit: Payer: Self-pay

## 2018-08-25 ENCOUNTER — Emergency Department (HOSPITAL_COMMUNITY)
Admission: EM | Admit: 2018-08-25 | Discharge: 2018-08-26 | Disposition: A | Discharge status: Home / Self Care | Payer: Medicare Other | Attending: Emergency Medicine | Admitting: Emergency Medicine

## 2018-08-25 ENCOUNTER — Encounter (HOSPITAL_COMMUNITY): Payer: Self-pay | Admitting: *Deleted

## 2018-08-25 DIAGNOSIS — Z87891 Personal history of nicotine dependence: Secondary | ICD-10-CM | POA: Insufficient documentation

## 2018-08-25 DIAGNOSIS — I10 Essential (primary) hypertension: Secondary | ICD-10-CM | POA: Insufficient documentation

## 2018-08-25 DIAGNOSIS — Z79899 Other long term (current) drug therapy: Secondary | ICD-10-CM | POA: Insufficient documentation

## 2018-08-25 LAB — CBC
HCT: 42.9 % (ref 36.0–46.0)
HEMOGLOBIN: 13.8 g/dL (ref 12.0–15.0)
MCH: 29.4 pg (ref 26.0–34.0)
MCHC: 32.2 g/dL (ref 30.0–36.0)
MCV: 91.5 fL (ref 80.0–100.0)
Platelets: 318 10*3/uL (ref 150–400)
RBC: 4.69 MIL/uL (ref 3.87–5.11)
RDW: 13.1 % (ref 11.5–15.5)
WBC: 6.4 10*3/uL (ref 4.0–10.5)
nRBC: 0 % (ref 0.0–0.2)

## 2018-08-25 NOTE — ED Provider Notes (Signed)
[facility] EMERGENCY DEPARTMENT Provider Note   CSN: [id number] Arrival date & time: 08/25/18  2121     History   Chief Complaint Chief Complaint  Patient presents with  . Hypertension    HPI Phyllis Johnson is a 67 y.o. female.  The history is provided by the patient and medical records. No language interpreter was used.  Hypertension  Associated symptoms include headaches (Resolved). Pertinent negatives include no chest pain, no abdominal pain and no shortness of breath.   Phyllis Johnson is a 67 y.o. female  with a PMH of HTN who presents to the Emergency Department complaining of concerns for elevated blood pressure reading tonight. She states that she was watching TV and felt very tired. She overall just didn't feel well, therefore took her blood pressure. It was 140's/101. She took a tablespoon of apple cider vinegar and waited a few minutes, then checked it again. It was 160's/100. She decided to come to ER. She reports BP typically is 120's-130's/80's. She is on valsartan with no missed doses. She had a little bit of a headache over the last 3 days. Intermittent, sharp. Not currently having a headache. Reports it as a 2/10. No visual changes. No abdominal pain, chest pain or shortness of breath. She has had a dry cough for about 3 weeks. Treated with z-pack for sinusitis or bronchitis. The cough has been continuing to improve for the last week. She had been taking Coricidin, however has not needed to take it yesterday or today as cough is dying down a bit.    Past Medical History:  Diagnosis Date  . Allergy   . Anxiety    under control with prayer and exercise.  . Arrhythmia    "irregular heart beat" during stress test in [place]  . Chronic bronchitis ([place])   . Depression    in remission with counselling  . Hypertension     Patient Active Problem List   Diagnosis Date Noted  . Stress at home 03/07/2018  . Atypical chest pain 11/11/2016  . Abnormal EKG  11/11/2016  . Situational anxiety 09/29/2016  . HTN (hypertension), benign 08/31/2016  . Chronic bronchitis with acute exacerbation ([place]) 08/31/2016  . Chronic allergic rhinitis 08/31/2016    Past Surgical History:  Procedure Laterality Date  . ABDOMINAL HYSTERECTOMY     with oophrectomy, secondary to large uterine fibriods  . BREAST LUMPECTOMY Right    R side benign     OB History   None      Home Medications    Prior to Admission medications   Medication Sig Start Date End Date Taking? Authorizing Provider  valsartan (DIOVAN) 160 MG tablet TAKE 1 TABLET BY MOUTH EVERY DAY Patient taking differently: Take 160 mg by mouth daily.  06/03/18  Yes Nche, Charlene Brooke, NP    Family History Family History  Problem Relation Age of Onset  . Hypertension Father   . Stroke Father        hemorrhagic  . Heart disease Father   . Heart attack Father 40  . COPD Sister   . Cancer Sister        lung secondary to tobacco use  . Fibroids Sister        uterine  . Cancer Cousin        breast  . Alcohol abuse Mother     Social History Social History   Tobacco Use  . Smoking status: Former Smoker    Packs/day: 1.50  Years: 30.00    Pack years: 45.00    Types: Cigarettes    Last attempt to quit: 2008    Years since quitting: 11.8  . Smokeless tobacco: Never Used  Substance Use Topics  . Alcohol use: No  . Drug use: No    Comment: Clean since 1993; previous crack-cocaine     Allergies   Patient has no known allergies.   Review of Systems Review of Systems  Constitutional: Positive for fatigue.  HENT: Positive for congestion.   Eyes: Negative for visual disturbance.  Respiratory: Positive for cough. Negative for shortness of breath and stridor.   Cardiovascular: Negative for chest pain.  Gastrointestinal: Negative for abdominal pain.  Neurological: Positive for headaches (Resolved).  All other systems reviewed and are negative.    Physical Exam Updated Vital  Signs BP (!) 146/96   Pulse (!) 57   Temp 97.9 F (36.6 C) (Oral)   Resp 13   Ht 4\' 11"  (1.499 m)   Wt 65.8 kg   SpO2 98%   BMI 29.29 kg/m   Physical Exam  Constitutional: She is oriented to person, place, and time. She appears well-developed and well-nourished. No distress.  HENT:  Head: Normocephalic and atraumatic.  No tenderness to temporal artery.   Neck: Neck supple. No JVD present.  Cardiovascular: Normal rate, regular rhythm and normal heart sounds.  No murmur heard. Pulmonary/Chest: Effort normal and breath sounds normal. No respiratory distress.  Lungs CTA bilaterally.   Abdominal: Soft. She exhibits no distension. There is no tenderness.  Musculoskeletal: She exhibits no edema.  Neurological: She is alert and oriented to person, place, and time.  Speech clear and goal oriented. CN 2-12 grossly intact. Normal finger-to-nose and rapid alternating movements. No drift. Strength and sensation intact. Steady gait.   Skin: Skin is warm and dry.  Nursing note and vitals reviewed.    ED Treatments / Results  Labs (all labs ordered are listed, but only abnormal results are displayed) Labs Reviewed  BASIC METABOLIC PANEL - Abnormal; Notable for the following components:      Result Value   Potassium 3.2 (*)    Glucose, Bld 107 (*)    All other components within normal limits  CBC    EKG EKG Interpretation  Date/Time:  Friday August 25 2018 21:32:48 EST Ventricular Rate:  87 PR Interval:    QRS Duration: 96 QT Interval:  395 QTC Calculation: 476 R Axis:   -58 Text Interpretation:  Sinus rhythm Left anterior fascicular block Low voltage, precordial leads Abnormal R-wave progression, early transition Consider anterior infarct Borderline repolarization abnormality Baseline wander in lead(s) V4 V5 V6 Interpretation limited secondary to artifact overall similar to previous Confirmed by Theotis Burrow [phone]) on 08/25/2018 9:36:25 PM   Radiology No results  found.  Procedures Procedures (including critical care time)  Medications Ordered in ED Medications - No data to display   Initial Impression / Assessment and Plan / ED Course  I have reviewed the triage vital signs and the nursing notes.  Pertinent labs & imaging results that were available during my care of the patient were reviewed by me and considered in my medical decision making (see chart for details).   Phyllis Johnson is a 67 y.o. female who presents to ED for concerns of elevated blood pressure today. Initial BP 172/104, however went down to 146/94 without treatment. Hx of HTN and been compliant with medications.  Patient is currently asymptomatic with no sxs to suggest end organ  damage. No chest pain, diaphoresis, nausea or other ACS symptoms. No current headache or neurologic complaints. No change in urine output. Normal cardiopulmonary exam. Intact and equal distal pulses. Labs reviewed and reassuring. She also endorses a cough for the last few weeks despite prior treatment with z-pack. Lungs are clear. OP with PND, but no tonsillar hypertrophy or exudates. Doubt PNA. Evaluation does not show pathology that would require ongoing emergent intervention or inpatient treatment. Discharged home in satisfactory condition with recommendation to follow up with PCP.    Patient seen by and discussed with Dr. Rex Kras who agrees with treatment plan.    Final Clinical Impressions(s) / ED Diagnoses   Final diagnoses:  Essential hypertension    ED Discharge Orders    None       Ward, Ozella Almond, PA-C 08/26/18 0059    Little, Wenda Overland, MD 08/26/18 [phone]

## 2018-08-25 NOTE — ED Triage Notes (Addendum)
Pt says she has been feeling tired tonight and she took her bp and it was elevated 140-160/100's. She has had a mild headache intermittent over the past several days. Pt says she has been taking BP med as prescribed. Also reports a cough for 1 month. She has been taking Coricidin for her cough.

## 2018-08-26 LAB — BASIC METABOLIC PANEL
Anion gap: 10 (ref 5–15)
BUN: 8 mg/dL (ref 8–23)
CHLORIDE: 100 mmol/L (ref 98–111)
CO2: 25 mmol/L (ref 22–32)
Calcium: 9.1 mg/dL (ref 8.9–10.3)
Creatinine, Ser: 0.76 mg/dL (ref 0.44–1.00)
GFR calc non Af Amer: >60 mL/min (ref >60)
Glucose, Bld: 107 mg/dL — ABNORMAL HIGH (ref 70–99)
Potassium: 3.2 mmol/L — ABNORMAL LOW (ref 3.5–5.1)
Sodium: 135 mmol/L (ref 135–145)

## 2018-08-26 NOTE — Discharge Instructions (Signed)
It was my pleasure taking care of you today!   Call your doctor on Monday to schedule a follow up appointment.   Return to ER for new or worsening symptoms, any additional concerns.

## 2018-08-27 ENCOUNTER — Emergency Department (HOSPITAL_COMMUNITY)
Admission: EM | Admit: 2018-08-27 | Discharge: 2018-08-27 | Disposition: A | Discharge status: Home / Self Care | Payer: Medicare Other | Attending: Emergency Medicine | Admitting: Emergency Medicine

## 2018-08-27 ENCOUNTER — Encounter (HOSPITAL_COMMUNITY): Payer: Self-pay | Admitting: Emergency Medicine

## 2018-08-27 ENCOUNTER — Other Ambulatory Visit: Payer: Self-pay

## 2018-08-27 DIAGNOSIS — R5383 Other fatigue: Secondary | ICD-10-CM | POA: Diagnosis not present

## 2018-08-27 DIAGNOSIS — I10 Essential (primary) hypertension: Secondary | ICD-10-CM | POA: Diagnosis not present

## 2018-08-27 DIAGNOSIS — Z87891 Personal history of nicotine dependence: Secondary | ICD-10-CM | POA: Diagnosis not present

## 2018-08-27 DIAGNOSIS — Z79899 Other long term (current) drug therapy: Secondary | ICD-10-CM | POA: Insufficient documentation

## 2018-08-27 DIAGNOSIS — R51 Headache: Secondary | ICD-10-CM | POA: Diagnosis not present

## 2018-08-27 LAB — I-STAT CHEM 8, ED
BUN: 7 mg/dL — AB (ref 8–23)
CALCIUM ION: 1.24 mmol/L (ref 1.15–1.40)
CHLORIDE: 104 mmol/L (ref 98–111)
Creatinine, Ser: 0.7 mg/dL (ref 0.44–1.00)
GLUCOSE: 105 mg/dL — AB (ref 70–99)
HCT: 44 % (ref 36.0–46.0)
Hemoglobin: 15 g/dL (ref 12.0–15.0)
POTASSIUM: 3.9 mmol/L (ref 3.5–5.1)
Sodium: 137 mmol/L (ref 135–145)
TCO2: 26 mmol/L (ref 22–32)

## 2018-08-27 LAB — I-STAT TROPONIN, ED: Troponin i, poc: 0 ng/mL (ref 0.00–0.08)

## 2018-08-27 NOTE — Discharge Instructions (Signed)
Call your primary care  tomorrow to arrange to be seen in her office within a week.  Your blood pressure should be rechecked in the office.  Today's was elevated at 161/92

## 2018-08-27 NOTE — ED Provider Notes (Signed)
[facility] EMERGENCY DEPARTMENT Provider Note   CSN: [id number] Arrival date & time: 08/27/18  1241     History   Chief Complaint Chief Complaint  Patient presents with  . Hypertension    HPI Phyllis Johnson is a 67 y.o. female.  Patient noted that her blood pressure was elevated this morning.  She has complained of fatigue and feeling tired for the past 1 day.  She complains of intermittent frontal headache that lasts a few seconds at a time.  No pain presently.  She denies having any chest pain denies shortness of breath denies nausea or vomiting no visual changes no other associated symptoms.  She treated herself with her usual blood pressure medicine this morning, valsartan  HPI  Past Medical History:  Diagnosis Date  . Allergy   . Anxiety    under control with prayer and exercise.  . Arrhythmia    "irregular heart beat" during stress test in [place]  . Chronic bronchitis ([place])   . Depression    in remission with counselling  . Hypertension     Patient Active Problem List   Diagnosis Date Noted  . Stress at home 03/07/2018  . Atypical chest pain 11/11/2016  . Abnormal EKG 11/11/2016  . Situational anxiety 09/29/2016  . HTN (hypertension), benign 08/31/2016  . Chronic bronchitis with acute exacerbation ([place]) 08/31/2016  . Chronic allergic rhinitis 08/31/2016    Past Surgical History:  Procedure Laterality Date  . ABDOMINAL HYSTERECTOMY     with oophrectomy, secondary to large uterine fibriods  . BREAST LUMPECTOMY Right    R side benign     OB History   None      Home Medications    Prior to Admission medications   Medication Sig Start Date End Date Taking? Authorizing Provider  valsartan (DIOVAN) 160 MG tablet TAKE 1 TABLET BY MOUTH EVERY DAY Patient taking differently: Take 160 mg by mouth daily.  06/03/18   Nche, Charlene Brooke, NP    Family History Family History  Problem Relation Age of Onset  . Hypertension Father   . Stroke  Father        hemorrhagic  . Heart disease Father   . Heart attack Father 81  . COPD Sister   . Cancer Sister        lung secondary to tobacco use  . Fibroids Sister        uterine  . Cancer Cousin        breast  . Alcohol abuse Mother     Social History Social History   Tobacco Use  . Smoking status: Former Smoker    Packs/day: 1.50    Years: 30.00    Pack years: 45.00    Types: Cigarettes    Last attempt to quit: 2008    Years since quitting: 11.8  . Smokeless tobacco: Never Used  Substance Use Topics  . Alcohol use: No  . Drug use: No    Comment: Clean since 1993; previous crack-cocaine     Allergies   Patient has no known allergies.   Review of Systems Review of Systems  Constitutional: Positive for fatigue.  HENT: Negative.   Respiratory: Negative.   Cardiovascular: Negative.   Gastrointestinal: Negative.   Musculoskeletal: Negative.   Skin: Negative.   Neurological: Positive for headaches.  Psychiatric/Behavioral: Negative.   All other systems reviewed and are negative.    Physical Exam Updated Vital Signs BP (!) 177/96 (BP Location: Right Arm)  Pulse (!) 59   Temp 97.9 F (36.6 C) (Oral)   Resp 16   Ht 4\' 11"  (1.499 m)   Wt 65.8 kg   SpO2 97%   BMI 29.29 kg/m   Physical Exam  Constitutional: She is oriented to person, place, and time. She appears well-developed and well-nourished. No distress.  HENT:  Head: Normocephalic and atraumatic.  Eyes: Pupils are equal, round, and reactive to light. Conjunctivae are normal.  Neck: Neck supple. No tracheal deviation present. No thyromegaly present.  Cardiovascular: Normal rate, regular rhythm and normal heart sounds.  No murmur heard. Pulmonary/Chest: Effort normal and breath sounds normal.  Abdominal: Soft. Bowel sounds are normal. She exhibits no distension. There is no tenderness.  Musculoskeletal: Normal range of motion. She exhibits no edema or tenderness.  Neurological: She is alert and  oriented to person, place, and time. No cranial nerve deficit. Coordination normal.  DTR symmetric bilaterally at knee jerk ankle jerk and biceps toes downgoing bilaterally  Skin: Skin is warm and dry. No rash noted.  Psychiatric: She has a normal mood and affect.  Nursing note and vitals reviewed.    ED Treatments / Results  Labs (all labs ordered are listed, but only abnormal results are displayed) Labs Reviewed  I-STAT CHEM 8, ED  I-STAT TROPONIN, ED    EKG EKG Interpretation  Date/Time:  Sunday August 27 2018 12:56:17 EST Ventricular Rate:  57 PR Interval:    QRS Duration: 87 QT Interval:  482 QTC Calculation: 470 R Axis:   -41 Text Interpretation:  Sinus rhythm Low voltage, precordial leads Abnormal R-wave progression, early transition Left ventricular hypertrophy Nonspecific T abnormalities, anterior leads No significant change since last tracing Confirmed by Orlie Dakin [phone]) on 08/27/2018 1:06:28 PM   Radiology No results found.  Procedures Procedures (including critical care time)  Medications Ordered in ED Medications - No data to display  Results for orders placed or performed during the hospital encounter of 08/27/18  I-stat chem 8, ed  Result Value Ref Range   Sodium 137 135 - 145 mmol/L   Potassium 3.9 3.5 - 5.1 mmol/L   Chloride 104 98 - 111 mmol/L   BUN 7 (L) 8 - 23 mg/dL   Creatinine, Ser 0.70 0.44 - 1.00 mg/dL   Glucose, Bld 105 (H) 70 - 99 mg/dL   Calcium, Ion 1.24 1.15 - 1.40 mmol/L   TCO2 26 22 - 32 mmol/L   Hemoglobin 15.0 12.0 - 15.0 g/dL   HCT 44.0 36.0 - 46.0 %  I-stat troponin, ED  Result Value Ref Range   Troponin i, poc 0.00 0.00 - 0.08 ng/mL   Comment 3           No results found. Initial Impression / Assessment and Plan / ED Course  I have reviewed the triage vital signs and the nursing notes.  Pertinent labs & imaging results that were available during my care of the patient were reviewed by me and considered in my  medical decision making (see chart for details).     2 PM patient asymptomatic.  Alert Glasgow Coma Score 15 No signs of endorgan damage.  Plan blood pressure recheck with PMD 1 week. Final Clinical Impressions(s) / ED Diagnoses  Diagnosis #1 fatigue #2 nonspecific headache #3 elevated blood pressure Final diagnoses:  None    ED Discharge Orders    None       Orlie Dakin, MD 08/27/18 1537

## 2018-08-27 NOTE — ED Triage Notes (Signed)
Pt presents to the ED from home. Pt complains of high blood pressure since yesterday. Pt was here Friday for the same reason. Pt states her BP was 160/104 this morning.

## 2018-09-04 ENCOUNTER — Ambulatory Visit: Payer: Medicare Other | Admitting: Nurse Practitioner

## 2018-09-04 ENCOUNTER — Encounter: Payer: Self-pay | Admitting: Nurse Practitioner

## 2018-09-04 VITALS — BP 140/92 | HR 55 | Temp 98.5°F | Ht 59.0 in | Wt 146.0 lb

## 2018-09-04 DIAGNOSIS — I10 Essential (primary) hypertension: Secondary | ICD-10-CM | POA: Diagnosis not present

## 2018-09-04 NOTE — Progress Notes (Signed)
   Subjective:  Patient ID: Phyllis Johnson, female    DOB: 02-12-1951  Age: 67 y.o. MRN: [id number]  CC: Hospitalization Follow-up (ER follow up for BP/BP last 3 days: 140/94,135/97, 138/99. )   HPI HTN: Improving per patient Current use of valsartan. She is not interested in taking additional medication at this time. She will like to continue diet and exercise at this time. Reports improved stress at home. BP Readings from Last 3 Encounters:  09/04/18 (!) 140/92  08/27/18 (!) 161/92  08/26/18 (!) 146/96   Reviewed past Medical, Social and Family history today.  Outpatient Medications Prior to Visit  Medication Sig Dispense Refill  . valsartan (DIOVAN) 160 MG tablet TAKE 1 TABLET BY MOUTH EVERY DAY (Patient taking differently: Take 160 mg by mouth daily. ) 90 tablet 3   No facility-administered medications prior to visit.     ROS See HPI  Objective:  BP (!) 140/92   Pulse (!) 55   Temp 98.5 F (36.9 C) (Oral)   Ht 4\' 11"  (1.499 m)   Wt 146 lb (66.2 kg)   SpO2 98%   BMI 29.49 kg/m   BP Readings from Last 3 Encounters:  09/04/18 (!) 140/92  08/27/18 (!) 161/92  08/26/18 (!) 146/96    Wt Readings from Last 3 Encounters:  09/04/18 146 lb (66.2 kg)  08/27/18 145 lb (65.8 kg)  08/25/18 145 lb (65.8 kg)    Physical Exam  Constitutional: She is oriented to person, place, and time.  Cardiovascular: Normal rate, regular rhythm and normal heart sounds.  No murmur heard. Pulmonary/Chest: Effort normal and breath sounds normal.  Musculoskeletal: She exhibits no edema.  Neurological: She is alert and oriented to person, place, and time.  Psychiatric: She has a normal mood and affect. Her behavior is normal.  Vitals reviewed.   Lab Results  Component Value Date   WBC 6.4 08/25/2018   HGB 15.0 08/27/2018   HCT 44.0 08/27/2018   PLT 318 08/25/2018   GLUCOSE 105 (H) 08/27/2018   CHOL 191 01/13/2018   TRIG 65.0 01/13/2018   HDL 82.00 01/13/2018   LDLCALC 96  01/13/2018   ALT 14 01/13/2018   AST 16 01/13/2018   NA 137 08/27/2018   K 3.9 08/27/2018   CL 104 08/27/2018   CREATININE 0.70 08/27/2018   BUN 7 (L) 08/27/2018   CO2 25 08/25/2018   TSH 0.53 11/05/2016    Assessment & Plan:   Leyah was seen today for hospitalization follow-up.  Diagnoses and all orders for this visit:  HTN (hypertension), benign   I am having [name] maintain her valsartan.  No orders of the defined types were placed in this encounter.   Problem List Items Addressed This Visit      Cardiovascular and Mediastinum   HTN (hypertension), benign - Primary       Follow-up: Return in about 3 months (around 12/05/2018) for HTN (97mins).  Wilfred Lacy, NP

## 2018-09-04 NOTE — Patient Instructions (Addendum)
Please check BP once a week and record. Decline amlodipine or maxzide rx to optimize BP control. She verbalized understanding about possible complications of uncontrolled BP.   DASH Eating Plan DASH stands for "Dietary Approaches to Stop Hypertension." The DASH eating plan is a healthy eating plan that has been shown to reduce high blood pressure (hypertension). It may also reduce your risk for type 2 diabetes, heart disease, and stroke. The DASH eating plan may also help with weight loss. What are tips for following this plan? General guidelines  Avoid eating more than 2,300 mg (milligrams) of salt (sodium) a day. If you have hypertension, you may need to reduce your sodium intake to 1,500 mg a day.  Limit alcohol intake to no more than 1 drink a day for nonpregnant women and 2 drinks a day for men. One drink equals 12 oz of beer, 5 oz of wine, or 1 oz of hard liquor.  Work with your health care provider to maintain a healthy body weight or to lose weight. Ask what an ideal weight is for you.  Get at least 30 minutes of exercise that causes your heart to beat faster (aerobic exercise) most days of the week. Activities may include walking, swimming, or biking.  Work with your health care provider or diet and nutrition specialist (dietitian) to adjust your eating plan to your individual calorie needs. Reading food labels  Check food labels for the amount of sodium per serving. Choose foods with less than 5 percent of the Daily Value of sodium. Generally, foods with less than 300 mg of sodium per serving fit into this eating plan.  To find whole grains, look for the word "whole" as the first word in the ingredient list. Shopping  Buy products labeled as "low-sodium" or "no salt added."  Buy fresh foods. Avoid canned foods and premade or frozen meals. Cooking  Avoid adding salt when cooking. Use salt-free seasonings or herbs instead of table salt or sea salt. Check with your health care  provider or pharmacist before using salt substitutes.  Do not fry foods. Cook foods using healthy methods such as baking, boiling, grilling, and broiling instead.  Cook with heart-healthy oils, such as olive, canola, soybean, or sunflower oil. Meal planning   Eat a balanced diet that includes: ? 5 or more servings of fruits and vegetables each day. At each meal, try to fill half of your plate with fruits and vegetables. ? Up to 6-8 servings of whole grains each day. ? Less than 6 oz of lean meat, poultry, or fish each day. A 3-oz serving of meat is about the same size as a deck of cards. One egg equals 1 oz. ? 2 servings of low-fat dairy each day. ? A serving of nuts, seeds, or beans 5 times each week. ? Heart-healthy fats. Healthy fats called Omega-3 fatty acids are found in foods such as flaxseeds and coldwater fish, like sardines, salmon, and mackerel.  Limit how much you eat of the following: ? Canned or prepackaged foods. ? Food that is high in trans fat, such as fried foods. ? Food that is high in saturated fat, such as fatty meat. ? Sweets, desserts, sugary drinks, and other foods with added sugar. ? Full-fat dairy products.  Do not salt foods before eating.  Try to eat at least 2 vegetarian meals each week.  Eat more home-cooked food and less restaurant, buffet, and fast food.  When eating at a restaurant, ask that your food be  prepared with less salt or no salt, if possible. What foods are recommended? The items listed may not be a complete list. Talk with your dietitian about what dietary choices are best for you. Grains Whole-grain or whole-wheat bread. Whole-grain or whole-wheat pasta. Brown rice. [place] Morrow. Bulgur. Whole-grain and low-sodium cereals. Pita bread. Low-fat, low-sodium crackers. Whole-wheat flour tortillas. Vegetables Fresh or frozen vegetables (raw, steamed, roasted, or grilled). Low-sodium or reduced-sodium tomato and vegetable juice. Low-sodium or  reduced-sodium tomato sauce and tomato paste. Low-sodium or reduced-sodium canned vegetables. Fruits All fresh, dried, or frozen fruit. Canned fruit in natural juice (without added sugar). Meat and other protein foods Skinless chicken or [place]. Ground chicken or [place]. Pork with fat trimmed off. Fish and seafood. Egg whites. Dried beans, peas, or lentils. Unsalted nuts, nut butters, and seeds. Unsalted canned beans. Lean cuts of beef with fat trimmed off. Low-sodium, lean deli meat. Dairy Low-fat (1%) or fat-free (skim) milk. Fat-free, low-fat, or reduced-fat cheeses. Nonfat, low-sodium ricotta or cottage cheese. Low-fat or nonfat yogurt. Low-fat, low-sodium cheese. Fats and oils Soft margarine without trans fats. Vegetable oil. Low-fat, reduced-fat, or light mayonnaise and salad dressings (reduced-sodium). Canola, safflower, olive, soybean, and sunflower oils. Avocado. Seasoning and other foods Herbs. Spices. Seasoning mixes without salt. Unsalted popcorn and pretzels. Fat-free sweets. What foods are not recommended? The items listed may not be a complete list. Talk with your dietitian about what dietary choices are best for you. Grains Baked goods made with fat, such as croissants, muffins, or some breads. Dry pasta or rice meal packs. Vegetables Creamed or fried vegetables. Vegetables in a cheese sauce. Regular canned vegetables (not low-sodium or reduced-sodium). Regular canned tomato sauce and paste (not low-sodium or reduced-sodium). Regular tomato and vegetable juice (not low-sodium or reduced-sodium). Angie Fava. Olives. Fruits Canned fruit in a light or heavy syrup. Fried fruit. Fruit in cream or butter sauce. Meat and other protein foods Fatty cuts of meat. Ribs. Fried meat. Berniece Salines. Sausage. Bologna and other processed lunch meats. Salami. Fatback. Hotdogs. Bratwurst. Salted nuts and seeds. Canned beans with added salt. Canned or smoked fish. Whole eggs or egg yolks. Chicken or [place]  with skin. Dairy Whole or 2% milk, cream, and half-and-half. Whole or full-fat cream cheese. Whole-fat or sweetened yogurt. Full-fat cheese. Nondairy creamers. Whipped toppings. Processed cheese and cheese spreads. Fats and oils Butter. Stick margarine. Lard. Shortening. Ghee. Bacon fat. Tropical oils, such as coconut, palm kernel, or palm oil. Seasoning and other foods Salted popcorn and pretzels. Onion salt, garlic salt, seasoned salt, table salt, and sea salt. Worcestershire sauce. Tartar sauce. Barbecue sauce. Teriyaki sauce. Soy sauce, including reduced-sodium. Steak sauce. Canned and packaged gravies. Fish sauce. Oyster sauce. Cocktail sauce. Horseradish that you find on the shelf. Ketchup. Mustard. Meat flavorings and tenderizers. Bouillon cubes. Hot sauce and Tabasco sauce. Premade or packaged marinades. Premade or packaged taco seasonings. Relishes. Regular salad dressings. Where to find more information:  National Heart, Lung, and [place]: https://wilson-eaton.com/  American Heart Association: www.heart.org Summary  The DASH eating plan is a healthy eating plan that has been shown to reduce high blood pressure (hypertension). It may also reduce your risk for type 2 diabetes, heart disease, and stroke.  With the DASH eating plan, you should limit salt (sodium) intake to 2,300 mg a day. If you have hypertension, you may need to reduce your sodium intake to 1,500 mg a day.  When on the DASH eating plan, aim to eat more fresh fruits and vegetables, whole grains,  lean proteins, low-fat dairy, and heart-healthy fats.  Work with your health care provider or diet and nutrition specialist (dietitian) to adjust your eating plan to your individual calorie needs. This information is not intended to replace advice given to you by your health care provider. Make sure you discuss any questions you have with your health care provider. Document Released: 09/16/2011 Document Revised: 09/20/2016  Document Reviewed: 09/20/2016 Elsevier Interactive Patient Education  [employer].

## 2018-12-08 ENCOUNTER — Encounter: Payer: Self-pay | Admitting: Nurse Practitioner

## 2018-12-08 ENCOUNTER — Ambulatory Visit (INDEPENDENT_AMBULATORY_CARE_PROVIDER_SITE_OTHER): Payer: Medicare Other | Admitting: Nurse Practitioner

## 2018-12-08 VITALS — BP 162/102 | HR 49 | Temp 97.8°F | Ht 59.0 in | Wt 144.4 lb

## 2018-12-08 DIAGNOSIS — I10 Essential (primary) hypertension: Secondary | ICD-10-CM | POA: Diagnosis not present

## 2018-12-08 MED ORDER — AMLODIPINE BESYLATE 5 MG PO TABS: 5.0000 mg | ORAL_TABLET | Freq: Every day | ORAL | 5 refills | Status: DC

## 2018-12-08 NOTE — Assessment & Plan Note (Signed)
Uncontrolled with valsartan. Reports mild headache with elevated BP, no change in vision, no edema, no chest pain, no dizziness. Maintains DASH diet and regular exercise. Echocardiogram done 12/2016: normal No indication of kidney disease or LVH.  Added amlodipine 5mg . F/up in 40month, repeat BMP

## 2018-12-08 NOTE — Progress Notes (Signed)
Subjective:  Patient ID: Phyllis Johnson, female    DOB: October 24, 1950  Age: 68 y.o. MRN: [id number]  CC: Follow-up (3 mo fu/FYI--denied all shot today,dexa? pt mention she might miss taking her med couples days- BP running at home around 129/93)  HPI  HTN: Uncontrolled with valsartan. Reports mild headache with elevated BP, no change in vision, no edema, no chest pain, no dizziness. Maintains DASH diet and regular exercise. Echocardiogram done 12/2016: normal No indication of kidney disease or LVH BP Readings from Last 3 Encounters:  12/08/18 (!) 162/102  09/04/18 (!) 140/92  08/27/18 (!) 161/92   Reviewed past Medical, Social and Family history today.  Outpatient Medications Prior to Visit  Medication Sig Dispense Refill  . valsartan (DIOVAN) 160 MG tablet TAKE 1 TABLET BY MOUTH EVERY DAY (Patient taking differently: Take 160 mg by mouth daily. ) 90 tablet 3   No facility-administered medications prior to visit.     ROS See HPI  Objective:  BP (!) 162/102   Pulse (!) 49   Temp 97.8 F (36.6 C) (Oral)   Ht 4\' 11"  (1.499 m)   Wt 144 lb 6.4 oz (65.5 kg)   SpO2 96%   BMI 29.17 kg/m   BP Readings from Last 3 Encounters:  12/08/18 (!) 162/102  09/04/18 (!) 140/92  08/27/18 (!) 161/92    Wt Readings from Last 3 Encounters:  12/08/18 144 lb 6.4 oz (65.5 kg)  09/04/18 146 lb (66.2 kg)  08/27/18 145 lb (65.8 kg)    Physical Exam Vitals signs reviewed.  Constitutional:      General: She is not in acute distress.    Appearance: Normal appearance.  Cardiovascular:     Rate and Rhythm: Normal rate and regular rhythm.  Pulmonary:     Effort: Pulmonary effort is normal.     Breath sounds: Normal breath sounds.  Musculoskeletal:     Right lower leg: No edema.     Left lower leg: No edema.  Neurological:     Mental Status: She is alert and oriented to person, place, and time.  Psychiatric:        Mood and Affect: Mood normal.        Behavior: Behavior normal.      Thought Content: Thought content normal.     Lab Results  Component Value Date   WBC 6.4 08/25/2018   HGB 15.0 08/27/2018   HCT 44.0 08/27/2018   PLT 318 08/25/2018   GLUCOSE 105 (H) 08/27/2018   CHOL 191 01/13/2018   TRIG 65.0 01/13/2018   HDL 82.00 01/13/2018   LDLCALC 96 01/13/2018   ALT 14 01/13/2018   AST 16 01/13/2018   NA 137 08/27/2018   K 3.9 08/27/2018   CL 104 08/27/2018   CREATININE 0.70 08/27/2018   BUN 7 (L) 08/27/2018   CO2 25 08/25/2018   TSH 0.53 11/05/2016    No results found.  Assessment & Plan:   Phyllis Johnson was seen today for follow-up.  Diagnoses and all orders for this visit:  HTN (hypertension), benign -     amLODipine (NORVASC) 5 MG tablet; Take 1 tablet (5 mg total) by mouth at bedtime.   I am having Phyllis Johnson start on amLODipine. I am also having her maintain her valsartan.  Meds ordered this encounter  Medications  . amLODipine (NORVASC) 5 MG tablet    Sig: Take 1 tablet (5 mg total) by mouth at bedtime.    Dispense:  30 tablet  Refill:  5    Order Specific Question:   Supervising Provider    Answer:   MATTHEWS, CODY [[id number]]    Problem List Items Addressed This Visit      Cardiovascular and Mediastinum   HTN (hypertension), benign - Primary   Relevant Medications   amLODipine (NORVASC) 5 MG tablet      Follow-up: Return in about 4 weeks (around 01/05/2019) for HTN (need BMP). need AWV with wellness coach.  Wilfred Lacy, NP

## 2018-12-08 NOTE — Patient Instructions (Signed)
Add amlodipine at bedtime for BP control.  Maintain DASH diet and adequate oral hydration.   DASH Eating Plan DASH stands for "Dietary Approaches to Stop Hypertension." The DASH eating plan is a healthy eating plan that has been shown to reduce high blood pressure (hypertension). It may also reduce your risk for type 2 diabetes, heart disease, and stroke. The DASH eating plan may also help with weight loss. What are tips for following this plan?  General guidelines  Avoid eating more than 2,300 mg (milligrams) of salt (sodium) a day. If you have hypertension, you may need to reduce your sodium intake to 1,500 mg a day.  Limit alcohol intake to no more than 1 drink a day for nonpregnant women and 2 drinks a day for men. One drink equals 12 oz of beer, 5 oz of wine, or 1 oz of hard liquor.  Work with your health care provider to maintain a healthy body weight or to lose weight. Ask what an ideal weight is for you.  Get at least 30 minutes of exercise that causes your heart to beat faster (aerobic exercise) most days of the week. Activities may include walking, swimming, or biking.  Work with your health care provider or diet and nutrition specialist (dietitian) to adjust your eating plan to your individual calorie needs. Reading food labels   Check food labels for the amount of sodium per serving. Choose foods with less than 5 percent of the Daily Value of sodium. Generally, foods with less than 300 mg of sodium per serving fit into this eating plan.  To find whole grains, look for the word "whole" as the first word in the ingredient list. Shopping  Buy products labeled as "low-sodium" or "no salt added."  Buy fresh foods. Avoid canned foods and premade or frozen meals. Cooking  Avoid adding salt when cooking. Use salt-free seasonings or herbs instead of table salt or sea salt. Check with your health care provider or pharmacist before using salt substitutes.  Do not fry foods. Cook  foods using healthy methods such as baking, boiling, grilling, and broiling instead.  Cook with heart-healthy oils, such as olive, canola, soybean, or sunflower oil. Meal planning  Eat a balanced diet that includes: ? 5 or more servings of fruits and vegetables each day. At each meal, try to fill half of your plate with fruits and vegetables. ? Up to 6-8 servings of whole grains each day. ? Less than 6 oz of lean meat, poultry, or fish each day. A 3-oz serving of meat is about the same size as a deck of cards. One egg equals 1 oz. ? 2 servings of low-fat dairy each day. ? A serving of nuts, seeds, or beans 5 times each week. ? Heart-healthy fats. Healthy fats called Omega-3 fatty acids are found in foods such as flaxseeds and coldwater fish, like sardines, salmon, and mackerel.  Limit how much you eat of the following: ? Canned or prepackaged foods. ? Food that is high in trans fat, such as fried foods. ? Food that is high in saturated fat, such as fatty meat. ? Sweets, desserts, sugary drinks, and other foods with added sugar. ? Full-fat dairy products.  Do not salt foods before eating.  Try to eat at least 2 vegetarian meals each week.  Eat more home-cooked food and less restaurant, buffet, and fast food.  When eating at a restaurant, ask that your food be prepared with less salt or no salt, if possible. What  foods are recommended? The items listed Federici not be a complete list. Talk with your dietitian about what dietary choices are best for you. Grains Whole-grain or whole-wheat bread. Whole-grain or whole-wheat pasta. Brown rice. [place]. Bulgur. Whole-grain and low-sodium cereals. Pita bread. Low-fat, low-sodium crackers. Whole-wheat flour tortillas. Vegetables Fresh or frozen vegetables (raw, steamed, roasted, or grilled). Low-sodium or reduced-sodium tomato and vegetable juice. Low-sodium or reduced-sodium tomato sauce and tomato paste. Low-sodium or reduced-sodium canned  vegetables. Fruits All fresh, dried, or frozen fruit. Canned fruit in natural juice (without added sugar). Meat and other protein foods Skinless chicken or [place]. Ground chicken or [place]. Pork with fat trimmed off. Fish and seafood. Egg whites. Dried beans, peas, or lentils. Unsalted nuts, nut butters, and seeds. Unsalted canned beans. Lean cuts of beef with fat trimmed off. Low-sodium, lean deli meat. Dairy Low-fat (1%) or fat-free (skim) milk. Fat-free, low-fat, or reduced-fat cheeses. Nonfat, low-sodium ricotta or cottage cheese. Low-fat or nonfat yogurt. Low-fat, low-sodium cheese. Fats and oils Soft margarine without trans fats. Vegetable oil. Low-fat, reduced-fat, or light mayonnaise and salad dressings (reduced-sodium). Canola, safflower, olive, soybean, and sunflower oils. Avocado. Seasoning and other foods Herbs. Spices. Seasoning mixes without salt. Unsalted popcorn and pretzels. Fat-free sweets. What foods are not recommended? The items listed Kubin not be a complete list. Talk with your dietitian about what dietary choices are best for you. Grains Baked goods made with fat, such as croissants, muffins, or some breads. Dry pasta or rice meal packs. Vegetables Creamed or fried vegetables. Vegetables in a cheese sauce. Regular canned vegetables (not low-sodium or reduced-sodium). Regular canned tomato sauce and paste (not low-sodium or reduced-sodium). Regular tomato and vegetable juice (not low-sodium or reduced-sodium). Angie Fava. Olives. Fruits Canned fruit in a light or heavy syrup. Fried fruit. Fruit in cream or butter sauce. Meat and other protein foods Fatty cuts of meat. Ribs. Fried meat. Berniece Salines. Sausage. Bologna and other processed lunch meats. Salami. Fatback. Hotdogs. Bratwurst. Salted nuts and seeds. Canned beans with added salt. Canned or smoked fish. Whole eggs or egg yolks. Chicken or [place] with skin. Dairy Whole or 2% milk, cream, and half-and-half. Whole or full-fat  cream cheese. Whole-fat or sweetened yogurt. Full-fat cheese. Nondairy creamers. Whipped toppings. Processed cheese and cheese spreads. Fats and oils Butter. Stick margarine. Lard. Shortening. Ghee. Bacon fat. Tropical oils, such as coconut, palm kernel, or palm oil. Seasoning and other foods Salted popcorn and pretzels. Onion salt, garlic salt, seasoned salt, table salt, and sea salt. Worcestershire sauce. Tartar sauce. Barbecue sauce. Teriyaki sauce. Soy sauce, including reduced-sodium. Steak sauce. Canned and packaged gravies. Fish sauce. Oyster sauce. Cocktail sauce. Horseradish that you find on the shelf. Ketchup. Mustard. Meat flavorings and tenderizers. Bouillon cubes. Hot sauce and Tabasco sauce. Premade or packaged marinades. Premade or packaged taco seasonings. Relishes. Regular salad dressings. Where to find more information:  National Heart, Lung, and [place]: https://wilson-eaton.com/  American Heart Association: www.heart.org Summary  The DASH eating plan is a healthy eating plan that has been shown to reduce high blood pressure (hypertension). It Kitzmiller also reduce your risk for type 2 diabetes, heart disease, and stroke.  With the DASH eating plan, you should limit salt (sodium) intake to 2,300 mg a day. If you have hypertension, you Mcc[place]hey need to reduce your sodium intake to 1,500 mg a day.  When on the DASH eating plan, aim to eat more fresh fruits and vegetables, whole grains, lean proteins, low-fat dairy, and heart-healthy fats.  Work with  your health care provider or diet and nutrition specialist (dietitian) to adjust your eating plan to your individual calorie needs. This information is not intended to replace advice given to you by your health care provider. Make sure you discuss any questions you have with your health care provider. Document Released: 09/16/2011 Document Revised: 09/20/2016 Document Reviewed: 09/20/2016 Elsevier Interactive Patient Education  2019 [employer].

## 2019-01-10 ENCOUNTER — Ambulatory Visit: Payer: Medicare Other | Admitting: *Deleted

## 2019-01-10 ENCOUNTER — Ambulatory Visit: Payer: Medicare Other | Admitting: Nurse Practitioner

## 2019-01-23 NOTE — Progress Notes (Signed)
Virtual Visit via Video Note  I connected with Phyllis Johnson on 01/24/19 at  1:00 PM EDT by a video enabled telemedicine application and verified that I am speaking with the correct person using two identifiers.   THIS ENCOUNTER IS A VIRTUAL VISIT DUE TO COVID-19 - PATIENT WAS NOT SEEN IN THE OFFICE. PATIENT HAS CONSENTED TO VIRTUAL VISIT / TELEMEDICINE VISIT   Location of patient: home  Location of provider: office  I discussed the limitations of evaluation and management by telemedicine and the availability of in person appointments. The patient expressed understanding and agreed to proceed.   Subjective:   Phyllis Johnson is a 68 y.o. female who presents for Medicare Annual (Subsequent) preventive examination.  Review of Systems: No ROS.  Medicare Wellness Visit. Additional risk factors are reflected in the social history. Cardiac Risk Factors include: advanced age (>67men, >103 women);hypertension Sleep patterns: Home Safety/Smoke Alarms: Feels safe in home. Smoke alarms in place.  Lives in 2nd floor apt. Daughter lives with her.   Female:   Pap- 09/29/16   Mammo- 04/27/18      Dexa scan- pt will wait until covid passes       CCS- 10/13/10.    Objective:     Vitals: Unable to assess. This visit is enabled though telemedicine due to Covid 19.   Advanced Directives 01/24/2019 08/27/2018 08/25/2018 11/01/2016 09/18/2016  Does Patient Have a Medical Advance Directive? No No No No No  Would patient like information on creating a medical advance directive? No - Patient declined No - Patient declined No - Patient declined - No - Patient declined    Tobacco Social History   Tobacco Use  Smoking Status Former Smoker  . Packs/day: 1.50  . Years: 30.00  . Pack years: 45.00  . Types: Cigarettes  . Last attempt to quit: 2008  . Years since quitting: 12.2  Smokeless Tobacco Never Used     Counseling given: Not Answered   Clinical Intake:     Pain : No/denies pain                  Past Medical History:  Diagnosis Date  . Allergy   . Anxiety    under control with prayer and exercise.  . Arrhythmia    "irregular heart beat" during stress test in [place]  . Chronic bronchitis ([place])   . Depression    in remission with counselling  . Hypertension    Past Surgical History:  Procedure Laterality Date  . ABDOMINAL HYSTERECTOMY     with oophrectomy, secondary to large uterine fibriods  . BREAST LUMPECTOMY Right    R side benign   Family History  Problem Relation Age of Onset  . Hypertension Father   . Stroke Father        hemorrhagic  . Heart disease Father   . Heart attack Father 61  . COPD Sister   . Cancer Sister        lung secondary to tobacco use  . Fibroids Sister        uterine  . Cancer Cousin        breast  . Alcohol abuse Mother    Social History   Socioeconomic History  . Marital status: Legally Separated    Spouse name: Not on file  . Number of children: 4  . Years of education: Not on file  . Highest education level: Not on file  Occupational History  . Occupation: retired  Scientific laboratory technician  .  Financial resource strain: Not hard at all  . Food insecurity:    Worry: Patient refused    Inability: Patient refused  . Transportation needs:    Medical: Not on file    Non-medical: Not on file  Tobacco Use  . Smoking status: Former Smoker    Packs/day: 1.50    Years: 30.00    Pack years: 45.00    Types: Cigarettes    Last attempt to quit: 2008    Years since quitting: 12.2  . Smokeless tobacco: Never Used  Substance and Sexual Activity  . Alcohol use: No  . Drug use: No    Comment: Clean since 1993; previous crack-cocaine  . Sexual activity: Not Currently  Lifestyle  . Physical activity:    Days per week: 7 days    Minutes per session: 60 min  . Stress: Only a little  Relationships  . Social connections:    Talks on phone: More than three times a week    Gets together: Once a week    Attends religious service: 1 to 4  times per year    Active member of club or organization: No    Attends meetings of clubs or organizations: Never    Relationship status: Not on file  Other Topics Concern  . Not on file  Social History Narrative  . Not on file    Outpatient Encounter Medications as of 01/24/2019  Medication Sig  . valsartan (DIOVAN) 160 MG tablet TAKE 1 TABLET BY MOUTH EVERY DAY (Patient taking differently: Take 160 mg by mouth daily. )  . amLODipine (NORVASC) 5 MG tablet Take 1 tablet (5 mg total) by mouth at bedtime. (Patient not taking: Reported on 01/24/2019)   No facility-administered encounter medications on file as of 01/24/2019.     Activities of Daily Living In your present state of health, do you have any difficulty performing the following activities: 01/24/2019  Hearing? N  Vision? N  Comment wears readers  Difficulty concentrating or making decisions? N  Walking or climbing stairs? N  Dressing or bathing? N  Doing errands, shopping? N  Preparing Food and eating ? N  Using the Toilet? N  In the past six months, have you accidently leaked urine? N  Do you have problems with loss of bowel control? N  Managing your Medications? N  Managing your Finances? N  Housekeeping or managing your Housekeeping? N  Some recent data might be hidden    Patient Care Team: Nche, Charlene Brooke, NP as PCP - General (Internal Medicine)    Assessment:   This is a routine wellness examination for Phyllis Johnson. Physical assessment deferred to PCP.  Exercise Activities and Dietary recommendations Current Exercise Habits: Home exercise routine, Type of exercise: walking, Time (Minutes): 40, Frequency (Times/Week): 4, Weekly Exercise (Minutes/Week): 160, Intensity: Mild, Exercise limited by: None identified   Diet (meal preparation, eat out, water intake, caffeinated beverages, dairy products, fruits and vegetables): in general, a "healthy" diet  , well balanced, on average, 3 meals per day   Goals    .  Exercise 150 min/wk Moderate Activity     Stay consistent with exercise and healthy diet.       Fall Risk Fall Risk  01/24/2019 12/08/2018 10/21/2017 10/21/2017  Falls in the past year? 0 0 No Yes  Number falls in past yr: - - - 1  Injury with Fall? - - - No   Depression Screen PHQ 2/9 Scores 01/24/2019 12/08/2018 10/21/2017 10/21/2017  PHQ -  2 Score 0 0 0 0     Cognitive Function Ad8 score reviewed for issues:  Issues making decisions:no  Less interest in hobbies / activities:no  Repeats questions, stories (family complaining):no  Trouble using ordinary gadgets (microwave, computer, phone):no  Forgets the month or year: no  Mismanaging finances: no  Remembering appts:no  Daily problems with thinking and/or memory:no Ad8 score is=0   MMSE - Mini Mental State Exam 10/21/2017  Orientation to time 5  Orientation to Place 5  Registration 3  Attention/ Calculation 5  Recall 3  Language- name 2 objects 2  Language- repeat 1  Language- follow 3 step command 3  Language- read & follow direction 1  Write a sentence 1  Copy design 1  Total score 30         There is no immunization history on file for this patient.   Screening Tests Health Maintenance  Topic Date Due  . TETANUS/TDAP  01/12/1972  . DEXA SCAN  01/11/2018  . PNA vac Low Risk Adult (1 of 2 - PCV13) 01/11/2018  . INFLUENZA VACCINE  05/12/2019  . MAMMOGRAM  04/27/2020  . COLONOSCOPY  10/13/2020  . Hepatitis C Screening  Completed      Plan:    Please schedule your next medicare wellness visit with me in 1 yr.  Continue to eat heart healthy diet (full of fruits, vegetables, whole grains, lean protein, water--limit salt, fat, and sugar intake) and increase physical activity as tolerated.  Continue doing brain stimulating activities (puzzles, reading, adult coloring books, staying active) to keep memory sharp.     I have personally reviewed and noted the following in the patient's chart:   .  Medical and social history . Use of alcohol, tobacco or illicit drugs  . Current medications and supplements . Functional ability and status . Nutritional status . Physical activity . Advanced directives . List of other physicians . Hospitalizations, surgeries, and ER visits in previous 12 months . Vitals . Screenings to include cognitive, depression, and falls . Referrals and appointments  In addition, I have reviewed and discussed with patient certain preventive protocols, quality metrics, and best practice recommendations. A written personalized care plan for preventive services as well as general preventive health recommendations were provided to patient.     Shela Nevin, [place]  01/24/2019

## 2019-01-24 ENCOUNTER — Ambulatory Visit (INDEPENDENT_AMBULATORY_CARE_PROVIDER_SITE_OTHER): Payer: Medicare Other | Admitting: *Deleted

## 2019-01-24 ENCOUNTER — Encounter: Payer: Self-pay | Admitting: *Deleted

## 2019-01-24 DIAGNOSIS — Z0000 Encounter for general adult medical examination without abnormal findings: Secondary | ICD-10-CM | POA: Diagnosis not present

## 2019-01-24 NOTE — Patient Instructions (Signed)
Please schedule your next medicare wellness visit with me in 1 yr.  Continue to eat heart healthy diet (full of fruits, vegetables, whole grains, lean protein, water--limit salt, fat, and sugar intake) and increase physical activity as tolerated.  Continue doing brain stimulating activities (puzzles, reading, adult coloring books, staying active) to keep memory sharp.    Phyllis Johnson , Thank you for taking time to come for your Medicare Wellness Visit. I appreciate your ongoing commitment to your health goals. Please review the following plan we discussed and let me know if I can assist you in the future.   These are the goals we discussed: Goals    . Exercise 150 min/wk Moderate Activity     Stay consistent with exercise and healthy diet.       This is a list of the screening recommended for you and due dates:  Health Maintenance  Topic Date Due  . Tetanus Vaccine  01/12/1972  . DEXA scan (bone density measurement)  01/11/2018  . Pneumonia vaccines (1 of 2 - PCV13) 01/11/2018  . Flu Shot  05/12/2019  . Mammogram  04/27/2020  . Colon Cancer Screening  10/13/2020  .  Hepatitis C: One time screening is recommended by Center for Disease Control  (CDC) for  adults born from [birth year] through 1963.   Completed    Health Maintenance After Age 59 After age 62, you are at a higher risk for certain long-term diseases and infections as well as injuries from falls. Falls are a major cause of broken bones and head injuries in people who are older than age 27. Getting regular preventive care can help to keep you healthy and well. Preventive care includes getting regular testing and making lifestyle changes as recommended by your health care provider. Talk with your health care provider about:  Which screenings and tests you should have. A screening is a test that checks for a disease when you have no symptoms.  A diet and exercise plan that is right for you. What should I know about screenings and  tests to prevent falls? Screening and testing are the best ways to find a health problem early. Early diagnosis and treatment give you the best chance of managing medical conditions that are common after age 40. Certain conditions and lifestyle choices may make you more likely to have a fall. Your health care provider may recommend:  Regular vision checks. Poor vision and conditions such as cataracts can make you more likely to have a fall. If you wear glasses, make sure to get your prescription updated if your vision changes.  Medicine review. Work with your health care provider to regularly review all of the medicines you are taking, including over-the-counter medicines. Ask your health care provider about any side effects that may make you more likely to have a fall. Tell your health care provider if any medicines that you take make you feel dizzy or sleepy.  Osteoporosis screening. Osteoporosis is a condition that causes the bones to get weaker. This can make the bones weak and cause them to break more easily.  Blood pressure screening. Blood pressure changes and medicines to control blood pressure can make you feel dizzy.  Strength and balance checks. Your health care provider may recommend certain tests to check your strength and balance while standing, walking, or changing positions.  Foot health exam. Foot pain and numbness, as well as not wearing proper footwear, can make you more likely to have a fall.  Depression screening.  You may be more likely to have a fall if you have a fear of falling, feel emotionally low, or feel unable to do activities that you used to do.  Alcohol use screening. Using too much alcohol can affect your balance and may make you more likely to have a fall. What actions can I take to lower my risk of falls? General instructions  Talk with your health care provider about your risks for falling. Tell your health care provider if: ? You fall. Be sure to tell your  health care provider about all falls, even ones that seem minor. ? You feel dizzy, sleepy, or off-balance.  Take over-the-counter and prescription medicines only as told by your health care provider. These include any supplements.  Eat a healthy diet and maintain a healthy weight. A healthy diet includes low-fat dairy products, low-fat (lean) meats, and fiber from whole grains, beans, and lots of fruits and vegetables. Home safety  Remove any tripping hazards, such as rugs, cords, and clutter.  Install safety equipment such as grab bars in bathrooms and safety rails on stairs.  Keep rooms and walkways well-lit. Activity   Follow a regular exercise program to stay fit. This will help you maintain your balance. Ask your health care provider what types of exercise are appropriate for you.  If you need a cane or walker, use it as recommended by your health care provider.  Wear supportive shoes that have nonskid soles. Lifestyle  Do not drink alcohol if your health care provider tells you not to drink.  If you drink alcohol, limit how much you have: ? 0-1 drink a day for women. ? 0-2 drinks a day for men.  Be aware of how much alcohol is in your drink. In the [place] one drink equals one typical bottle of beer (12 oz), one-half glass of wine (5 oz), or one shot of hard liquor (1 oz).  Do not use any products that contain nicotine or tobacco, such as cigarettes and e-cigarettes. If you need help quitting, ask your health care provider. Summary  Having a healthy lifestyle and getting preventive care can help to protect your health and wellness after age 81.  Screening and testing are the best way to find a health problem early and help you avoid having a fall. Early diagnosis and treatment give you the best chance for managing medical conditions that are more common for people who are older than age 95.  Falls are a major cause of broken bones and head injuries in people who are older  than age 65. Take precautions to prevent a fall at home.  Work with your health care provider to learn what changes you can make to improve your health and wellness and to prevent falls. This information is not intended to replace advice given to you by your health care provider. Make sure you discuss any questions you have with your health care provider. Document Released: 08/10/2017 Document Revised: 08/10/2017 Document Reviewed: 08/10/2017 Elsevier Interactive Patient Education  2019 [employer].

## 2019-01-24 NOTE — Progress Notes (Signed)
Medical screening examination/treatment/procedure(s) were performed by the Wellness Coach, RN. As primary care provider I was immediately available for consulation/collaboration. I agree with above documentation. Marg Macmaster, AGNP-C 

## 2019-01-29 ENCOUNTER — Ambulatory Visit (INDEPENDENT_AMBULATORY_CARE_PROVIDER_SITE_OTHER): Payer: Medicare Other | Admitting: Nurse Practitioner

## 2019-01-29 ENCOUNTER — Encounter: Payer: Self-pay | Admitting: Nurse Practitioner

## 2019-01-29 DIAGNOSIS — I10 Essential (primary) hypertension: Secondary | ICD-10-CM | POA: Diagnosis not present

## 2019-01-29 MED ORDER — VALSARTAN 160 MG PO TABS: 160.0000 mg | ORAL_TABLET | Freq: Every day | ORAL | 1 refills | Status: DC

## 2019-01-29 NOTE — Progress Notes (Signed)
Virtual Visit via Video Note  I connected with Phyllis Johnson on 01/29/19 at 10:00 AM EDT by a video enabled telemedicine application and verified that I am speaking with the correct person using two identifiers.   I discussed the limitations of evaluation and management by telemedicine and the availability of in person appointments. The patient expressed understanding and agreed to proceed.  CC: F/U BP---log: BP--  4/13: 137/92,101/74,124/84 0/30--131/43,888/75,797/28 4/15--132/87,122/82 [phone] 4/17--119/81,127/90 4/18--138/95 4/19--139/94.   History of Present Illness: HTN: Improved with valsartan only. Reports she did not feel comfortable adding amlodipine as previously prescribed. Denies any acute complain. BP Readings from Last 3 Encounters:  01/29/19 (!) 126/92  12/08/18 (!) 162/102  09/04/18 (!) 140/92   Observations/Objective: Physical Exam  Constitutional: She is oriented to person, place, and time. No distress.  Pulmonary/Chest: Effort normal.  Neurological: She is alert and oriented to person, place, and time.  Psychiatric: She has a normal mood and affect. Her behavior is normal. Thought content normal.  Vitals reviewed.  Assessment and Plan: Phyllis Johnson was seen today for follow-up.  Diagnoses and all orders for this visit:  HTN (hypertension), benign -     valsartan (DIOVAN) 160 MG tablet; Take 1 tablet (160 mg total) by mouth daily.   Follow Up Instructions: F/up in office in 28months (fasting) Continue valsartan only at this time and continue to check BP 2-3times a week.   I discussed the assessment and treatment plan with the patient. The patient was provided an opportunity to ask questions and all were answered. The patient agreed with the plan and demonstrated an understanding of the instructions.   The patient was advised to call back or seek an in-person evaluation if the symptoms worsen or if the condition fails to improve as  anticipated.   Wilfred Lacy, NP

## 2019-04-16 ENCOUNTER — Telehealth: Payer: Self-pay | Admitting: Nurse Practitioner

## 2019-04-16 NOTE — Telephone Encounter (Signed)
Questions for Screening COVID-19  Symptom onset: n/a  Travel or Contacts: no  During this illness, did/does the patient experience any of the following symptoms? Fever >100.51F []   Yes [x]   No []   Unknown Subjective fever (felt feverish) []   Yes [x]   No []   Unknown Chills []   Yes [x]   No []   Unknown Muscle aches (myalgia) []   Yes [x]   No []   Unknown Runny nose (rhinorrhea) []   Yes [x]   No []   Unknown Sore throat []   Yes [x]   No []   Unknown Cough (new onset or worsening of chronic cough) []   Yes [x]   No []   Unknown Shortness of breath (dyspnea) []   Yes [x]   No []   Unknown Nausea or vomiting []   Yes [x]   No []   Unknown Headache []   Yes [x]   No []   Unknown Abdominal pain  []   Yes [x]   No []   Unknown Diarrhea (?3 loose/looser than normal stools/24hr period) []   Yes [x]   No []   Unknown Other, specify:  Patient risk factors: Smoker? []   Current []   Former []   Never If female, currently pregnant? []   Yes []   No  Patient Active Problem List   Diagnosis Date Noted  . Stress at home 03/07/2018  . Atypical chest pain 11/11/2016  . Abnormal EKG 11/11/2016  . Situational anxiety 09/29/2016  . HTN (hypertension), benign 08/31/2016  . Chronic bronchitis with acute exacerbation ([place]) 08/31/2016  . Chronic allergic rhinitis 08/31/2016    Plan:  []   High risk for COVID-19 with red flags go to ED (with CP, SOB, weak/lightheaded, or fever > 101.5). Call ahead.  []   High risk for COVID-19 but stable. Inform provider and coordinate time for [facility] visit.   []   No red flags but URI signs or symptoms okay for [facility] visit.

## 2019-04-17 ENCOUNTER — Encounter: Payer: Self-pay | Admitting: Nurse Practitioner

## 2019-04-17 ENCOUNTER — Ambulatory Visit (INDEPENDENT_AMBULATORY_CARE_PROVIDER_SITE_OTHER): Payer: Medicare Other | Admitting: Nurse Practitioner

## 2019-04-17 VITALS — BP 142/100 | HR 63 | Temp 98.9°F | Ht 59.0 in | Wt 141.4 lb

## 2019-04-17 DIAGNOSIS — Z13220 Encounter for screening for lipoid disorders: Secondary | ICD-10-CM | POA: Diagnosis not present

## 2019-04-17 DIAGNOSIS — Z136 Encounter for screening for cardiovascular disorders: Secondary | ICD-10-CM | POA: Diagnosis not present

## 2019-04-17 DIAGNOSIS — I10 Essential (primary) hypertension: Secondary | ICD-10-CM

## 2019-04-17 DIAGNOSIS — Z780 Asymptomatic menopausal state: Secondary | ICD-10-CM

## 2019-04-17 NOTE — Assessment & Plan Note (Addendum)
Daughter moved in with 68yrs old grandson. Reports daughter has erratic behavior and neglecting her responsibility as mother. She thinks her daughter is using an illicit drug (possibly heroin, marijuna and ETOH) She is not sure how to handle situation at this time. She has reached out to her church friends and pastor for assistance. States "this is getting to be too much. Something has to give. I will just have to take it one day at a time"   Kicked daughter out of home at this time Relying on spiritual strength, prayers and support of friends at this time

## 2019-04-17 NOTE — Progress Notes (Signed)
Subjective:  Patient ID: Phyllis Johnson, female    DOB: 17-Jan-1951  Age: 68 y.o. MRN: [id number]  CC: Follow-up (follow up BP and lipid/fasting/)  HPI  HTN: Reports home BP readings 100s-120s/70s-80s after valsartan. BP reading before medication: 130s/80s BP Readings from Last 3 Encounters:  04/17/19 (!) 142/100  01/29/19 (!) 126/92  12/08/18 (!) 162/102   Reviewed past Medical, Social and Family history today.  Outpatient Medications Prior to Visit  Medication Sig Dispense Refill  . Multiple Vitamin (MULTIVITAMIN) tablet Take 1 tablet by mouth daily.    . Omega-3 Fatty Acids (FISH OIL PO) Take by mouth.    . valsartan (DIOVAN) 160 MG tablet Take 1 tablet (160 mg total) by mouth daily. 90 tablet 1   No facility-administered medications prior to visit.     ROS Review of Systems  Constitutional: Negative.   Eyes: Negative.   Respiratory: Negative.   Cardiovascular: Negative.   Gastrointestinal: Negative.   Genitourinary: Negative.   Musculoskeletal: Negative.   Skin: Negative.   Neurological: Negative.   Psychiatric/Behavioral: Negative for depression and suicidal ideas. The patient is nervous/anxious. The patient does not have insomnia.    Objective:  BP (!) 142/100   Pulse 63   Temp 98.9 F (37.2 C) (Oral)   Ht 4\' 11"  (3.825 m)   Wt 141 lb 6.4 oz (64.1 kg)   SpO2 95%   BMI 28.56 kg/m   BP Readings from Last 3 Encounters:  04/17/19 (!) 142/100  01/29/19 (!) 126/92  12/08/18 (!) 162/102    Wt Readings from Last 3 Encounters:  04/17/19 141 lb 6.4 oz (64.1 kg)  01/29/19 142 lb (64.4 kg)  12/08/18 144 lb 6.4 oz (65.5 kg)    Physical Exam Vitals signs reviewed.  Cardiovascular:     Rate and Rhythm: Normal rate and regular rhythm.  Pulmonary:     Effort: Pulmonary effort is normal.     Breath sounds: Normal breath sounds.  Neurological:     Mental Status: She is alert and oriented to person, place, and time.  Psychiatric:        Mood and Affect: Mood  normal.        Behavior: Behavior normal.        Thought Content: Thought content normal.     Lab Results  Component Value Date   WBC 6.4 08/25/2018   HGB 15.0 08/27/2018   HCT 44.0 08/27/2018   PLT 318 08/25/2018   GLUCOSE 105 (H) 08/27/2018   CHOL 191 01/13/2018   TRIG 65.0 01/13/2018   HDL 82.00 01/13/2018   LDLCALC 96 01/13/2018   ALT 14 01/13/2018   AST 16 01/13/2018   NA 137 08/27/2018   K 3.9 08/27/2018   CL 104 08/27/2018   CREATININE 0.70 08/27/2018   BUN 7 (L) 08/27/2018   CO2 25 08/25/2018   TSH 0.53 11/05/2016    Assessment & Plan:   Dayanne was seen today for follow-up.  Diagnoses and all orders for this visit:  HTN (hypertension), benign -     Basic metabolic panel -     TSH  Encounter for lipid screening for cardiovascular disease -     Lipid panel  Asymptomatic age-related postmenopausal state -     DG Bone Density; Future   I am having [name] maintain her valsartan, multivitamin, and Omega-3 Fatty Acids (FISH OIL PO).  No orders of the defined types were placed in this encounter.   Problem List Items Addressed This Visit  Cardiovascular and Mediastinum   HTN (hypertension), benign - Primary   Relevant Orders   Basic metabolic panel   TSH    Other Visit Diagnoses    Encounter for lipid screening for cardiovascular disease       Relevant Orders   Lipid panel   Asymptomatic age-related postmenopausal state       Relevant Orders   DG Bone Density      Follow-up: Return in about 6 months (around 10/18/2019) for CPE(fasting).  Wilfred Lacy, NP

## 2019-04-17 NOTE — Patient Instructions (Signed)
Go to lab for blood draw.  Check BP in AM and bedtime. Send BP reading in 1week through mychart.

## 2019-04-17 NOTE — Assessment & Plan Note (Signed)
>>  ASSESSMENT AND PLAN FOR SITUATIONAL ANXIETY WRITTEN ON 04/17/2019 10:48 AM BY Mickie Badders LUM, NP  Daughter moved in with 61yrs old grandson. Reports daughter has erratic behavior and neglecting her responsibility as mother. She thinks her daughter is using an illicit drug (possibly heroin, marijuna and ETOH) She is not sure how to handle situation at this time. She has reached out to her church friends and pastor for assistance. States "this is getting to be too much. Something has to give. I will just have to take it one day at a time"   Kicked daughter put of home Relying on spiritual strength, prayers and support of friends at this time

## 2019-04-17 NOTE — Assessment & Plan Note (Addendum)
Daughter moved in with 23yrs old grandson. Reports daughter has erratic behavior and neglecting her responsibility as mother. She thinks her daughter is using an illicit drug (possibly heroin, marijuna and ETOH) She is not sure how to handle situation at this time. She has reached out to her church friends and pastor for assistance. States "this is getting to be too much. Something has to give. I will just have to take it one day at a time"   Kicked daughter put of home Relying on spiritual strength, prayers and support of friends at this time

## 2019-04-18 LAB — BASIC METABOLIC PANEL
BUN: 8 mg/dL (ref 7–25)
CO2: 25 mmol/L (ref 20–32)
Calcium: 9.4 mg/dL (ref 8.6–10.4)
Chloride: 104 mmol/L (ref 98–110)
Creat: 0.77 mg/dL (ref 0.50–0.99)
Glucose, Bld: 101 mg/dL — ABNORMAL HIGH (ref 65–99)
Potassium: 4.2 mmol/L (ref 3.5–5.3)
Sodium: 138 mmol/L (ref 135–146)

## 2019-04-18 LAB — LIPID PANEL
Cholesterol: 187 mg/dL (ref <200)
HDL: 79 mg/dL (ref >=50)
LDL Cholesterol (Calc): 94 mg/dL (calc)
Non-HDL Cholesterol (Calc): 108 mg/dL (calc) (ref <130)
Total CHOL/HDL Ratio: 2.4 (calc) (ref <5.0)
Triglycerides: 62 mg/dL (ref <150)

## 2019-04-18 LAB — TSH: TSH: 1.08 mIU/L (ref 0.40–4.50)

## 2019-04-25 ENCOUNTER — Encounter: Payer: Self-pay | Admitting: Nurse Practitioner

## 2019-04-25 DIAGNOSIS — I10 Essential (primary) hypertension: Secondary | ICD-10-CM

## 2019-04-26 ENCOUNTER — Telehealth: Payer: Self-pay | Admitting: Nurse Practitioner

## 2019-04-26 ENCOUNTER — Other Ambulatory Visit: Payer: Self-pay | Admitting: Nurse Practitioner

## 2019-04-26 DIAGNOSIS — Z13820 Encounter for screening for osteoporosis: Secondary | ICD-10-CM

## 2019-04-26 MED ORDER — VALSARTAN 80 MG PO TABS: 80.0000 mg | ORAL_TABLET | Freq: Every day | ORAL | 3 refills | Status: DC

## 2019-04-26 NOTE — Telephone Encounter (Signed)
Pt stated he BP is 96/66 and need to know what does she need to do. Please advise pt.

## 2019-04-26 NOTE — Telephone Encounter (Signed)
Dr. Loletha Grayer please advise in [place] absence today.

## 2019-04-26 NOTE — Telephone Encounter (Signed)
Rx for 80mg  diovan daily sent to [facility] as requested. Correct, no more BP med today. Start 80mg  daily tomorrow and check BP daily x 2 wk then f/u with PCP

## 2019-04-26 NOTE — Telephone Encounter (Signed)
Would recommend a decrease in dose of diovan from 160mg  to 80mg  daily. I can send new Rx for 80mg  dose to her pharmacy. Which is her preferred pharm? Recommend pt check BP at home daily or BID x 2 wks then f/u with PCP to review BP log.

## 2019-04-30 NOTE — Telephone Encounter (Signed)
Please see other note for this concern. Addressed by Dr. Loletha Grayer in absence of [place].

## 2019-04-30 NOTE — Telephone Encounter (Signed)
LVM for the pt to call back and see how she is doing since we change her med.

## 2019-05-01 ENCOUNTER — Encounter: Payer: Self-pay | Admitting: Nurse Practitioner

## 2019-05-02 ENCOUNTER — Other Ambulatory Visit: Payer: Self-pay | Admitting: Nurse Practitioner

## 2019-05-02 DIAGNOSIS — Z1231 Encounter for screening mammogram for malignant neoplasm of breast: Secondary | ICD-10-CM

## 2019-05-08 ENCOUNTER — Encounter: Payer: Self-pay | Admitting: Nurse Practitioner

## 2019-05-08 DIAGNOSIS — I10 Essential (primary) hypertension: Secondary | ICD-10-CM

## 2019-05-08 MED ORDER — VALSARTAN 40 MG PO TABS: 40.0000 mg | ORAL_TABLET | Freq: Every day | ORAL | 5 refills | Status: DC

## 2019-05-08 NOTE — Addendum Note (Signed)
Addended by: Wilfred Lacy L on: 05/08/2019 01:12 PM   Modules accepted: Orders

## 2019-06-05 ENCOUNTER — Ambulatory Visit: Payer: Self-pay | Admitting: Nurse Practitioner

## 2019-06-05 NOTE — Telephone Encounter (Signed)
Please advise 

## 2019-06-05 NOTE — Telephone Encounter (Signed)
Maintain current dose of medication- 80mg , adequate oral hydration and low sodium diet. Schedule F/up appt when you return to [place].

## 2019-06-05 NOTE — Telephone Encounter (Signed)
Pt report BP reading: 05/28/19--on 40 mg--125/92 am and 139/97 pm 05/29/19--on 80 mg--144/95 am & 128/83 pm 05/30/19--      "          140/92 am & 143/97 pm 05/31/19--      "  147/90 am & 126/82 pm 06/01/19   120/85 am & 127/84 pm 06/02/19  140/92 am & 127/88 pm 06/03/19  157/107 am & 162/104 pm 06/04/19  153/100 am & 151/107 pm 06/05/19  " 146/105

## 2019-06-05 NOTE — Telephone Encounter (Signed)
Please have her to send a copy of daily BP recordings for last 1week. She can send it through [employer].

## 2019-06-05 NOTE — Telephone Encounter (Signed)
Pt  reports BP trending up, onset 2 weeks ago. States diastolic 97 - 98 1 week ago. Reports Valsartan dose had been decreased to 40mg  from 80 mg; states she started taking the 80 mg dose again. Has been taking 80 mg x 1 week.   Reports BP Sunday 163/104, this AM 147/105. States highest diastolic 107 last week. Denies any other symptoms other than increased fatigue.  Pt also reports she stopped taking herbal supplement \'Ashawganda\' for anxiety and would like to start taking again.  Pt is in [place] presently. Is not able to get into MyChart, no access to computer.   CB# [phone]  Reason for Disposition . Systolic BP  >= 160 OR Diastolic >= 100  Answer Assessment - Initial Assessment Questions 1. BLOOD PRESSURE: "What is the blood pressure?" "Did you take at least two measurements 5 minutes apart?"     14 7/105 2. ONSET: "When did you take your blood pressure?"     This AM 3. HOW: "How did you obtain the blood pressure?" (e.g., visiting nurse, automatic home BP monitor)     Home monitor 4. HISTORY: "Do you have a history of high blood pressure?"     yes 5. MEDICATIONS: "Are you taking any medications for blood pressure?" "Have you missed any doses recently?"     no 6. OTHER SYMPTOMS: "Do you have any symptoms?" (e.g., headache, chest pain, blurred vision, difficulty breathing, weakness)    Fatigued  Protocols used: HIGH BLOOD PRESSURE-A-AH

## 2019-06-06 NOTE — Telephone Encounter (Signed)
Pt is aware and report BP this morning was 132/96 with HR is 62.   Phyllis Johnson--

## 2019-06-12 ENCOUNTER — Telehealth: Payer: Self-pay | Admitting: Nurse Practitioner

## 2019-06-12 NOTE — Telephone Encounter (Signed)
Correction Travel--went to [place] 8/16-8/30.

## 2019-06-12 NOTE — Telephone Encounter (Signed)

## 2019-06-13 ENCOUNTER — Encounter: Payer: Self-pay | Admitting: Nurse Practitioner

## 2019-06-13 ENCOUNTER — Ambulatory Visit (INDEPENDENT_AMBULATORY_CARE_PROVIDER_SITE_OTHER): Payer: Medicare Other | Admitting: Nurse Practitioner

## 2019-06-13 ENCOUNTER — Other Ambulatory Visit: Payer: Self-pay

## 2019-06-13 VITALS — BP 172/110 | HR 61 | Temp 97.7°F | Ht 59.0 in | Wt 139.4 lb

## 2019-06-13 DIAGNOSIS — F411 Generalized anxiety disorder: Secondary | ICD-10-CM | POA: Diagnosis not present

## 2019-06-13 DIAGNOSIS — I10 Essential (primary) hypertension: Secondary | ICD-10-CM | POA: Diagnosis not present

## 2019-06-13 MED ORDER — ESCITALOPRAM OXALATE 10 MG PO TABS: 10.0000 mg | ORAL_TABLET | Freq: Every day | ORAL | 2 refills | Status: DC

## 2019-06-13 MED ORDER — VALSARTAN 40 MG PO TABS: 80.0000 mg | ORAL_TABLET | Freq: Every day | ORAL | 5 refills | Status: DC

## 2019-06-13 NOTE — Progress Notes (Signed)
Subjective:  Patient ID: Phyllis Johnson, female    DOB: May 17, 1951  Age: 68 y.o. MRN: [id number]  CC: Follow-up (follow up for BP--BP is better---has reading--very nervouse today no flu shot)  HPI GAD and Depression: Chronic, ongoing for several years, related to substance abuse within family and separation from husband. Waxing and waning, no medication used in past.  She has always declined referral to therapist and use of medication, but today she verbalizes a need to take something to stablize her mood. She reports this might have a huge impact on her BP.  HTN: Compliant with valsartan 80mg ? Persistent elevate BP No headache, no chest pain, no dizziness.  Reviewed past Medical, Social and Family history today.  Outpatient Medications Prior to Visit  Medication Sig Dispense Refill  . Multiple Vitamin (MULTIVITAMIN) tablet Take 1 tablet by mouth daily.    . Omega-3 Fatty Acids (FISH OIL PO) Take by mouth.    . valsartan (DIOVAN) 40 MG tablet Take 1 tablet (40 mg total) by mouth daily. 30 tablet 5   No facility-administered medications prior to visit.     ROS See HPI  Objective:  BP (!) 172/110   Pulse 61   Temp 97.7 F (36.5 C) (Tympanic)   Ht 4\' 11"  (1.499 m)   Wt 139 lb 6.4 oz (63.2 kg)   SpO2 98%   BMI 28.16 kg/m   BP Readings from Last 3 Encounters:  06/13/19 (!) 172/110  04/17/19 (!) 142/100  01/29/19 (!) 126/92    Wt Readings from Last 3 Encounters:  06/13/19 139 lb 6.4 oz (63.2 kg)  04/17/19 141 lb 6.4 oz (64.1 kg)  01/29/19 142 lb (64.4 kg)    Physical Exam Vitals signs reviewed.  Cardiovascular:     Rate and Rhythm: Normal rate.     Pulses: Normal pulses.  Pulmonary:     Effort: Pulmonary effort is normal.  Musculoskeletal: Normal range of motion.  Neurological:     Mental Status: She is alert and oriented to person, place, and time.  Psychiatric:        Attention and Perception: Attention normal.        Mood and Affect: Mood is anxious.         Speech: Speech is rapid and pressured.        Behavior: Behavior is cooperative.        Thought Content: Thought content is not delusional. Thought content does not include homicidal or suicidal ideation. Thought content does not include homicidal or suicidal plan.        Cognition and Memory: Cognition normal.        Judgment: Judgment normal.     Lab Results  Component Value Date   WBC 6.4 08/25/2018   HGB 15.0 08/27/2018   HCT 44.0 08/27/2018   PLT 318 08/25/2018   GLUCOSE 101 (H) 04/17/2019   CHOL 187 04/17/2019   TRIG 62 04/17/2019   HDL 79 04/17/2019   LDLCALC 94 04/17/2019   ALT 14 01/13/2018   AST 16 01/13/2018   NA 138 04/17/2019   K 4.2 04/17/2019   CL 104 04/17/2019   CREATININE 0.77 04/17/2019   BUN 8 04/17/2019   CO2 25 04/17/2019   TSH 1.08 04/17/2019    Assessment & Plan:   Phyllis Johnson was seen today for follow-up.  Diagnoses and all orders for this visit:  GAD (generalized anxiety disorder) -     escitalopram (LEXAPRO) 10 MG tablet; Take 1 tablet (10 mg total)  by mouth daily after breakfast.  Essential hypertension -     valsartan (DIOVAN) 40 MG tablet; Take 2 tablets (80 mg total) by mouth daily.   I have changed Phyllis Johnson's valsartan. I am also having her start on escitalopram. Additionally, I am having her maintain her multivitamin and Omega-3 Fatty Acids (FISH OIL PO).  Meds ordered this encounter  Medications  . escitalopram (LEXAPRO) 10 MG tablet    Sig: Take 1 tablet (10 mg total) by mouth daily after breakfast.    Dispense:  30 tablet    Refill:  2    Order Specific Question:   Supervising Provider    Answer:   Lucille Passy [[id number]]  . valsartan (DIOVAN) 40 MG tablet    Sig: Take 2 tablets (80 mg total) by mouth daily.    Dispense:  60 tablet    Refill:  5    Order Specific Question:   Supervising Provider    Answer:   Lucille Passy [[id number]]    Problem List Items Addressed This Visit    None    Visit Diagnoses    GAD  (generalized anxiety disorder)    -  Primary   Relevant Medications   escitalopram (LEXAPRO) 10 MG tablet   Essential hypertension       Relevant Medications   valsartan (DIOVAN) 40 MG tablet       Follow-up: Return in about 2 weeks (around 06/27/2019) for HTn and anxiety (37mins).  Wilfred Lacy, NP

## 2019-06-13 NOTE — Patient Instructions (Addendum)
I instructed pt to start 1/2 tablet once daily for 1 week and then increase to a full tablet once daily on week two as tolerated.   We discussed common side effects such as nausea, drowsiness and weight gain.  Also discussed rare but serious side effect of suicide ideation.  She is instructed to discontinue medication go directly to ED if this occurs.  Pt verbalizes understanding.   Plan follow up in 2weeks to evaluate progress.    Maintain current dose of valsartan.  Escitalopram tablets What is this medicine? ESCITALOPRAM (es sye TAL oh pram) is used to treat depression and certain types of anxiety. This medicine may be used for other purposes; ask your health care provider or pharmacist if you have questions. COMMON BRAND NAME(S): Lexapro What should I tell my health care provider before I take this medicine? They need to know if you have any of these conditions:  bipolar disorder or a family history of bipolar disorder  diabetes  glaucoma  heart disease  kidney or liver disease  receiving electroconvulsive therapy  seizures (convulsions)  suicidal thoughts, plans, or attempt by you or a family member  an unusual or allergic reaction to escitalopram, the related drug citalopram, other medicines, foods, dyes, or preservatives  pregnant or trying to become pregnant  breast-feeding How should I use this medicine? Take this medicine by mouth with a glass of water. Follow the directions on the prescription label. You can take it with or without food. If it upsets your stomach, take it with food. Take your medicine at regular intervals. Do not take it more often than directed. Do not stop taking this medicine suddenly except upon the advice of your doctor. Stopping this medicine too quickly may cause serious side effects or your condition may worsen. A special MedGuide will be given to you by the pharmacist with each prescription and refill. Be sure to read this information  carefully each time. Talk to your pediatrician regarding the use of this medicine in children. Special care may be needed. Overdosage: If you think you have taken too much of this medicine contact a poison control center or emergency room at once. NOTE: This medicine is only for you. Do not share this medicine with others. What if I miss a dose? If you miss a dose, take it as soon as you can. If it is almost time for your next dose, take only that dose. Do not take double or extra doses. What may interact with this medicine? Do not take this medicine with any of the following medications:  certain medicines for fungal infections like fluconazole, itraconazole, ketoconazole, posaconazole, voriconazole  cisapride  citalopram  dronedarone  linezolid  MAOIs like Carbex, Eldepryl, Marplan, Nardil, and Parnate  methylene blue (injected into a vein)  pimozide  thioridazine This medicine may also interact with the following medications:  alcohol  amphetamines  aspirin and aspirin-like medicines  carbamazepine  certain medicines for depression, anxiety, or psychotic disturbances  certain medicines for migraine headache like almotriptan, eletriptan, frovatriptan, naratriptan, rizatriptan, sumatriptan, zolmitriptan  certain medicines for sleep  certain medicines that treat or prevent blood clots like warfarin, enoxaparin, dalteparin  cimetidine  diuretics  dofetilide  fentanyl  furazolidone  isoniazid  lithium  metoprolol  NSAIDs, medicines for pain and inflammation, like ibuprofen or naproxen  other medicines that prolong the QT interval (cause an abnormal heart rhythm)  procarbazine  rasagiline  supplements like St. John's wort, kava kava, valerian  tramadol  tryptophan  ziprasidone This list may not describe all possible interactions. Give your health care provider a list of all the medicines, herbs, non-prescription drugs, or dietary supplements you  use. Also tell them if you smoke, drink alcohol, or use illegal drugs. Some items may interact with your medicine. What should I watch for while using this medicine? Tell your doctor if your symptoms do not get better or if they get worse. Visit your doctor or health care professional for regular checks on your progress. Because it may take several weeks to see the full effects of this medicine, it is important to continue your treatment as prescribed by your doctor. Patients and their families should watch out for new or worsening thoughts of suicide or depression. Also watch out for sudden changes in feelings such as feeling anxious, agitated, panicky, irritable, hostile, aggressive, impulsive, severely restless, overly excited and hyperactive, or not being able to sleep. If this happens, especially at the beginning of treatment or after a change in dose, call your health care professional. Dennis Bast may get drowsy or dizzy. Do not drive, use machinery, or do anything that needs mental alertness until you know how this medicine affects you. Do not stand or sit up quickly, especially if you are an older patient. This reduces the risk of dizzy or fainting spells. Alcohol may interfere with the effect of this medicine. Avoid alcoholic drinks. Your mouth may get dry. Chewing sugarless gum or sucking hard candy, and drinking plenty of water may help. Contact your doctor if the problem does not go away or is severe. What side effects may I notice from receiving this medicine? Side effects that you should report to your doctor or health care professional as soon as possible:  allergic reactions like skin rash, itching or hives, swelling of the face, lips, or tongue  anxious  black, tarry stools  changes in vision  confusion  elevated mood, decreased need for sleep, racing thoughts, impulsive behavior  eye pain  fast, irregular heartbeat  feeling faint or lightheaded, falls  feeling agitated, angry,  or irritable  hallucination, loss of contact with reality  loss of balance or coordination  loss of memory  painful or prolonged erections  restlessness, pacing, inability to keep still  seizures  stiff muscles  suicidal thoughts or other mood changes  trouble sleeping  unusual bleeding or bruising  unusually weak or tired  vomiting Side effects that usually do not require medical attention (report to your doctor or health care professional if they continue or are bothersome):  changes in appetite  change in sex drive or performance  headache  increased sweating  indigestion, nausea  tremors This list may not describe all possible side effects. Call your doctor for medical advice about side effects. You may report side effects to FDA at 1-800-FDA-1088. Where should I keep my medicine? Keep out of reach of children. Store at room temperature between 15 and 30 degrees C (59 and 86 degrees F). Throw away any unused medicine after the expiration date. NOTE: This sheet is a summary. It may not cover all possible information. If you have questions about this medicine, talk to your doctor, pharmacist, or health care provider.  2020 Elsevier/Gold Standard (2018-09-18 11:21:44)

## 2019-06-14 ENCOUNTER — Encounter: Payer: Self-pay | Admitting: Nurse Practitioner

## 2019-06-26 ENCOUNTER — Telehealth: Payer: Self-pay | Admitting: Nurse Practitioner

## 2019-06-26 NOTE — Telephone Encounter (Signed)

## 2019-06-27 ENCOUNTER — Encounter: Payer: Self-pay | Admitting: Nurse Practitioner

## 2019-06-27 ENCOUNTER — Other Ambulatory Visit: Payer: Self-pay

## 2019-06-27 ENCOUNTER — Telehealth (INDEPENDENT_AMBULATORY_CARE_PROVIDER_SITE_OTHER): Payer: Medicare Other | Admitting: Nurse Practitioner

## 2019-06-27 ENCOUNTER — Telehealth (HOSPITAL_COMMUNITY): Payer: Self-pay | Admitting: Rehabilitation

## 2019-06-27 VITALS — BP 155/95 | Ht 59.0 in | Wt 139.0 lb

## 2019-06-27 DIAGNOSIS — F439 Reaction to severe stress, unspecified: Secondary | ICD-10-CM

## 2019-06-27 DIAGNOSIS — I10 Essential (primary) hypertension: Secondary | ICD-10-CM

## 2019-06-27 DIAGNOSIS — F411 Generalized anxiety disorder: Secondary | ICD-10-CM

## 2019-06-27 DIAGNOSIS — J209 Acute bronchitis, unspecified: Secondary | ICD-10-CM | POA: Diagnosis not present

## 2019-06-27 DIAGNOSIS — J42 Unspecified chronic bronchitis: Secondary | ICD-10-CM

## 2019-06-27 MED ORDER — AMLODIPINE BESYLATE 5 MG PO TABS: 5.0000 mg | ORAL_TABLET | Freq: Every day | ORAL | 2 refills | Status: DC

## 2019-06-27 MED ORDER — BENZONATATE 100 MG PO CAPS: 100.0000 mg | ORAL_CAPSULE | Freq: Three times a day (TID) | ORAL | 0 refills | Status: DC | PRN

## 2019-06-27 MED ORDER — ALBUTEROL SULFATE HFA 108 (90 BASE) MCG/ACT IN AERS: 1.0000 | INHALATION_SPRAY | Freq: Four times a day (QID) | RESPIRATORY_TRACT | 0 refills | Status: DC | PRN

## 2019-06-27 NOTE — Telephone Encounter (Signed)
I reached out to this patient to schedule a Renal Duplex ordered by Wilfred Lacy. The patient did not answer, so I left a voice mail message requesting that the patient call back to schedule an appointment.

## 2019-06-27 NOTE — Assessment & Plan Note (Signed)
Uncontrolled with valsartan 80mg  Home BP readings: AM (159/101, 141/97, 157/98, 153/96, 162/103, 128/94, 131/88) PM (15//95, 139/83, 138/88, 154/96, 150/96, 115/84, 151/96) Denies any headache or dizziness or chest pain or LE edema Current use of valsartan 80mg  Previous declined to take amlodipine due to concerns about possible side effects. Previous use of valsartan 160mg , but dose decreased due to increased fatigue and hypotension (04/25/2019).  Start amlodipine 5mg  at HS Continue valsartan 80mg  Ordered renal duplex F/up in 62month

## 2019-06-27 NOTE — Assessment & Plan Note (Signed)
States " 1tab of lexapro made be feel funny" Declined to try another medication or lower dose. Agreed to go for counseling

## 2019-06-27 NOTE — Progress Notes (Signed)
Virtual Visit via Video Note  I connected with Phyllis Johnson on 06/28/19 at  1:00 PM EDT by a video enabled telemedicine application and verified that I am speaking with the correct person using two identifiers.  Location: Patient: Home Provider: Office   I discussed the limitations of evaluation and management by telemedicine and the availability of in person appointments. The patient expressed understanding and agreed to proceed.  CC: HTN and anxiety f/up. Also complains of worsening cough in last 1week.  History of Present Illness: HTN: Home BP readings: AM (159/101, 141/97, 157/98, 153/96, 162/103, 128/94, 131/88) PM (15//95, 139/83, 138/88, 154/96, 150/96, 115/84, 151/96) Denies any headache or dizziness or chest pain or LE edema Current use of valsartan 80mg  Previous declined to take amlodipine due to concerns about possible side effects. Previous use of valsartan 160mg , but dose decreased due to increased fatigue and hypotension (04/25/2019) BP Readings from Last 3 Encounters:  06/27/19 (!) 155/95  06/13/19 (!) 172/110  04/17/19 (!) 142/100   Cough: Hx of chronic bronchitis Chronic cough, waxing and waning, worse in last 1week (productive, clear sputum, no SOB/PND/chest pain/fever/sinus congestion/GERD symptoms) Declines use of ICS or montelukast.  Observations/Objective: Physical Exam  Constitutional: She is oriented to person, place, and time. She appears well-developed.  Cardiovascular: Normal rate.  Pulmonary/Chest: Effort normal.  Neurological: She is alert and oriented to person, place, and time.  limited exam due to video call.  Assessment and Plan: [name] was seen today for follow-up and cough.  Diagnoses and all orders for this visit:  HTN (hypertension), benign -     VAS US RENAL ARTERY DUPLEX; Future -     amLODipine (NORVASC) 5 MG tablet; Take 1 tablet (5 mg total) by mouth at bedtime.  Chronic bronchitis with acute exacerbation (HCC) -     albuterol  (VENTOLIN HFA) 108 (90 Base) MCG/ACT inhaler; Inhale 1-2 puffs into the lungs every 6 (six) hours as needed. -     benzonatate (TESSALON) 100 MG capsule; Take 1 capsule (100 mg total) by mouth 3 (three) times daily as needed for cough.  GAD (generalized anxiety disorder) -     Ambulatory referral to Psychology  Stress at home -     Ambulatory referral to Psychology   Follow Up Instructions: See avs   I discussed the assessment and treatment plan with the patient. The patient was provided an opportunity to ask questions and all were answered. The patient agreed with the plan and demonstrated an understanding of the instructions.   The patient was advised to call back or seek an in-person evaluation if the symptoms worsen or if the condition fails to improve as anticipated.  Phyllis Lacy, NP

## 2019-06-27 NOTE — Assessment & Plan Note (Signed)
Chronic cough, waxing and waning, worse in last 1week (productive, clear sputum, no SOB/PND/chest pain/fever/sinus congestion/GERD symptoms) Declines use of ICS or montelukast  Try albuterol and benzonatate prn F/up in 83month

## 2019-06-28 ENCOUNTER — Telehealth (HOSPITAL_COMMUNITY): Payer: Self-pay | Admitting: *Deleted

## 2019-06-28 ENCOUNTER — Encounter: Payer: Self-pay | Admitting: Nurse Practitioner

## 2019-06-28 NOTE — Telephone Encounter (Signed)
Second msg left for pt to call our office to schedule.

## 2019-06-28 NOTE — Patient Instructions (Addendum)
Continue valsartan. Start amlodipine 5mg  at hs. Use albuterol and benzonatate for cough. F/up in 76month for HTN and cough re eval You will be contacted to schedule appt with therapist and for renal duplex.

## 2019-07-03 ENCOUNTER — Other Ambulatory Visit: Payer: Self-pay

## 2019-07-03 ENCOUNTER — Ambulatory Visit (HOSPITAL_COMMUNITY)
Admission: RE | Admit: 2019-07-03 | Discharge: 2019-07-03 | Disposition: A | Discharge status: Home / Self Care | Payer: Medicare Other | Source: Ambulatory Visit | Attending: Nurse Practitioner | Admitting: Nurse Practitioner

## 2019-07-03 ENCOUNTER — Encounter: Payer: Self-pay | Admitting: Nurse Practitioner

## 2019-07-03 DIAGNOSIS — I10 Essential (primary) hypertension: Secondary | ICD-10-CM

## 2019-07-03 DIAGNOSIS — I701 Atherosclerosis of renal artery: Secondary | ICD-10-CM | POA: Insufficient documentation

## 2019-07-05 ENCOUNTER — Encounter: Payer: Self-pay | Admitting: Nurse Practitioner

## 2019-07-05 ENCOUNTER — Telehealth: Payer: Self-pay | Admitting: Nurse Practitioner

## 2019-07-05 ENCOUNTER — Other Ambulatory Visit: Payer: Self-pay | Admitting: Nurse Practitioner

## 2019-07-05 DIAGNOSIS — F411 Generalized anxiety disorder: Secondary | ICD-10-CM

## 2019-07-05 NOTE — Telephone Encounter (Signed)
Phyllis Johnson pleas advise, ok to send in 90 day supply?   Pt has 2 refills left after pick up the fist 30 days supply on 06/13/2019.

## 2019-07-05 NOTE — Telephone Encounter (Signed)
Pt is aware.  

## 2019-07-05 NOTE — Telephone Encounter (Signed)
Ok to hold valsartan for today, recheck BP tomorrow am.  Nothing urgent regarding [place], results can wait until Baldo Ash returns tomorrow.

## 2019-07-05 NOTE — Telephone Encounter (Signed)
Pt called stating that her BP this morning was 107/82. Pt took amlodipine last night but afraid to take valsartan this morning due to BP reading. Please advise in absence of Baldo Ash today, see [place] result as well please.

## 2019-07-06 ENCOUNTER — Telehealth: Payer: Self-pay | Admitting: Nurse Practitioner

## 2019-07-06 ENCOUNTER — Encounter: Payer: Self-pay | Admitting: Nurse Practitioner

## 2019-07-06 NOTE — Telephone Encounter (Signed)
Pt was told to skip her valsartan med yesterday morning on 07-05-2019 . Pt take her amlodine last night and pt would like to know it she should resume her valsartan this morning pt normally take med at 9 am. Pt has sent my chart message. Pt bp is now 147/94. Pt is not having any symptoms

## 2019-07-06 NOTE — Telephone Encounter (Signed)
Contacted Phyllis Johnson about labile BP readings since yesterday. I gave her the following instructions: Hold Valsartan if BP less than of equal to 120/70 Take 40mg  of Valsartan if BP greater than or equal to 130/80 Take 80mg  of valsartan if BP greater than or equal to 150/90. Tonight hold amlodipine if BP less than 130/80. She verbalized understanding.

## 2019-07-06 NOTE — Telephone Encounter (Signed)
Pt stated she just took the valsartan 80 mg, please advise  Call both # if not answer

## 2019-07-09 ENCOUNTER — Telehealth: Payer: Self-pay | Admitting: Nurse Practitioner

## 2019-07-09 NOTE — Telephone Encounter (Signed)
Contacted Phyllis Johnson to discuss BP reading over the weekend. Saturday: AM 125/86 PM 115/77 Sunday: AM 133/92 PM 122/97 Monday: AM 147/87 She states she did not take amlodipine on Saturday and Sunday. We reviewed BP parameters provided on Friday. I advised her to follow parameter provided on Friday about when to take Valsartan. I added that she should hold amlodipine and valsartan if BP is less than or equal to 120/70. She should check BP in AM and PM before taking medication. She should record BP reading and bring to upcoming appt 07/25/2019 at 1pm. She verbalized understanding.

## 2019-07-17 ENCOUNTER — Ambulatory Visit
Admission: RE | Admit: 2019-07-17 | Discharge: 2019-07-17 | Disposition: A | Discharge status: Home / Self Care | Payer: Medicare Other | Source: Ambulatory Visit | Attending: Nurse Practitioner | Admitting: Nurse Practitioner

## 2019-07-17 ENCOUNTER — Other Ambulatory Visit: Payer: Self-pay

## 2019-07-17 DIAGNOSIS — Z13820 Encounter for screening for osteoporosis: Secondary | ICD-10-CM

## 2019-07-17 DIAGNOSIS — Z1231 Encounter for screening mammogram for malignant neoplasm of breast: Secondary | ICD-10-CM

## 2019-07-21 ENCOUNTER — Other Ambulatory Visit: Payer: Self-pay | Admitting: Nurse Practitioner

## 2019-07-21 DIAGNOSIS — J209 Acute bronchitis, unspecified: Secondary | ICD-10-CM

## 2019-07-24 ENCOUNTER — Telehealth: Payer: Self-pay | Admitting: Nurse Practitioner

## 2019-07-24 NOTE — Telephone Encounter (Signed)

## 2019-07-25 ENCOUNTER — Encounter: Payer: Self-pay | Admitting: Nurse Practitioner

## 2019-07-25 ENCOUNTER — Ambulatory Visit (INDEPENDENT_AMBULATORY_CARE_PROVIDER_SITE_OTHER): Payer: Medicare Other | Admitting: Nurse Practitioner

## 2019-07-25 ENCOUNTER — Other Ambulatory Visit: Payer: Self-pay

## 2019-07-25 VITALS — BP 134/85 | HR 51 | Temp 97.7°F | Ht 59.0 in

## 2019-07-25 DIAGNOSIS — I10 Essential (primary) hypertension: Secondary | ICD-10-CM

## 2019-07-25 DIAGNOSIS — F411 Generalized anxiety disorder: Secondary | ICD-10-CM

## 2019-07-25 MED ORDER — AMLODIPINE BESYLATE 5 MG PO TABS: 5.0000 mg | ORAL_TABLET | Freq: Every day | ORAL | 5 refills | Status: DC

## 2019-07-25 NOTE — Assessment & Plan Note (Signed)
She denies need for lexapro at this time. She prefers to use spiritual guidance and exercise at this time.

## 2019-07-25 NOTE — Assessment & Plan Note (Signed)
Continue valsartan and amlodipine F/up in 110months

## 2019-07-25 NOTE — Patient Instructions (Signed)
Maintain current medications and BP guidelines. F/up in 45months

## 2019-07-25 NOTE — Progress Notes (Signed)
Virtual Visit via Video Note  I connected with Phyllis Johnson on 07/25/19 at  1:00 PM EDT by a video enabled telemedicine application and verified that I am speaking with the correct person using two identifiers.  Location: Patient: Home Provider: office   I discussed the limitations of evaluation and management by telemedicine and the availability of in person appointments. The patient expressed understanding and agreed to proceed.  CC: HTN and anxiety f/up.  History of Present Illness: HTN: Improved with amlodipine at hs and valsartan 40-80mg . She had to skip amlodipine dose twice in last 2weeks. She denies any adverse side effects with current medications  She denies need for lexapro at this time. She prefers to use spiritual guidance and exercise at this time. Depression screen [facility] 2/9 07/25/2019 01/24/2019 12/08/2018  Decreased Interest 0 0 0  Down, Depressed, Hopeless 0 0 0  PHQ - 2 Score 0 0 0  Altered sleeping 0 - -  Tired, decreased energy 0 - -  Change in appetite 0 - -  Feeling bad or failure about yourself  0 - -  Trouble concentrating 0 - -  Moving slowly or fidgety/restless 0 - -  Suicidal thoughts 0 - -  PHQ-9 Score 0 - -    GAD 7 : Generalized Anxiety Score 07/25/2019  Nervous, Anxious, on Edge 0  Control/stop worrying 0  Worry too much - different things 0  Trouble relaxing 0  Restless 0  Easily annoyed or irritable 0  Afraid - awful might happen 0  Total GAD 7 Score 0   Observations/Objective: Physical Exam  Constitutional: She is oriented to person, place, and time.  Pulmonary/Chest: Effort normal.  Neurological: She is alert and oriented to person, place, and time.  Vitals reviewed.  Assessment and Plan: Phyllis Johnson was seen today for follow-up.  Diagnoses and all orders for this visit:  HTN (hypertension), benign -     amLODipine (NORVASC) 5 MG tablet; Take 1 tablet (5 mg total) by mouth at bedtime.  GAD (generalized anxiety  disorder)   Follow Up Instructions: See avs   I discussed the assessment and treatment plan with the patient. The patient was provided an opportunity to ask questions and all were answered. The patient agreed with the plan and demonstrated an understanding of the instructions.   The patient was advised to call back or seek an in-person evaluation if the symptoms worsen or if the condition fails to improve as anticipated.   Wilfred Lacy, NP

## 2019-08-05 ENCOUNTER — Encounter: Payer: Self-pay | Admitting: Nurse Practitioner

## 2019-09-03 ENCOUNTER — Other Ambulatory Visit: Payer: Self-pay

## 2019-09-03 ENCOUNTER — Encounter: Payer: Self-pay | Admitting: Nurse Practitioner

## 2019-09-03 DIAGNOSIS — Z20822 Contact with and (suspected) exposure to covid-19: Secondary | ICD-10-CM

## 2019-09-05 LAB — NOVEL CORONAVIRUS, NAA: SARS-CoV-2, NAA: NOT DETECTED

## 2019-10-22 ENCOUNTER — Other Ambulatory Visit: Payer: Self-pay

## 2019-10-23 ENCOUNTER — Encounter: Payer: Self-pay | Admitting: Nurse Practitioner

## 2019-10-23 ENCOUNTER — Ambulatory Visit (INDEPENDENT_AMBULATORY_CARE_PROVIDER_SITE_OTHER): Payer: Medicare Other | Admitting: Nurse Practitioner

## 2019-10-23 VITALS — BP 136/82 | HR 53 | Temp 97.0°F | Ht 59.0 in | Wt 139.6 lb

## 2019-10-23 DIAGNOSIS — R739 Hyperglycemia, unspecified: Secondary | ICD-10-CM | POA: Diagnosis not present

## 2019-10-23 DIAGNOSIS — I10 Essential (primary) hypertension: Secondary | ICD-10-CM

## 2019-10-23 LAB — RENAL FUNCTION PANEL
Albumin: 4.4 g/dL (ref 3.5–5.2)
BUN: 10 mg/dL (ref 6–23)
CO2: 29 mEq/L (ref 19–32)
Calcium: 10.3 mg/dL (ref 8.4–10.5)
Chloride: 100 mEq/L (ref 96–112)
Creatinine, Ser: 0.72 mg/dL (ref 0.40–1.20)
GFR: 97.83 mL/min (ref >60.00)
Glucose, Bld: 84 mg/dL (ref 70–99)
Phosphorus: 3.8 mg/dL (ref 2.3–4.6)
Potassium: 4.2 mEq/L (ref 3.5–5.1)
Sodium: 137 mEq/L (ref 135–145)

## 2019-10-23 LAB — HEMOGLOBIN A1C: Hgb A1c MFr Bld: 6 % (ref 4.6–6.5)

## 2019-10-23 NOTE — Progress Notes (Signed)
Subjective:  Patient ID: Phyllis Johnson, female    DOB: [birth year]  Age: 69 y.o. MRN: [id number]  CC: Follow-up (follow up on BP--has reading)  HPI HTN: Stable with amlodipine and valsartan Reports home BP reading 110s-120s/70s-80s No headache, no LE edema, no dizziness, no SOB, no CP.  Reviewed past Medical, Social and Family history today.  Outpatient Medications Prior to Visit  Medication Sig Dispense Refill  . amLODipine (NORVASC) 5 MG tablet Take 1 tablet (5 mg total) by mouth at bedtime. 30 tablet 5  . Multiple Vitamin (MULTIVITAMIN) tablet Take 1 tablet by mouth daily.    . Omega-3 Fatty Acids (FISH OIL PO) Take by mouth.    . valsartan (DIOVAN) 40 MG tablet Take 2 tablets (80 mg total) by mouth daily. 60 tablet 5   No facility-administered medications prior to visit.    ROS See HPI  Objective:  BP 136/82   Pulse (!) 53   Temp (!) 97 F (36.1 C) (Tympanic)   Ht 4\' 11"  (1.499 m)   Wt 139 lb 9.6 oz (63.3 kg)   SpO2 98%   BMI 28.20 kg/m   BP Readings from Last 3 Encounters:  10/23/19 136/82  07/25/19 134/85  06/27/19 (!) 155/95    Wt Readings from Last 3 Encounters:  10/23/19 139 lb 9.6 oz (63.3 kg)  06/27/19 139 lb (63 kg)  06/13/19 139 lb 6.4 oz (63.2 kg)    Physical Exam Vitals reviewed.  Constitutional:      Appearance: She is obese.  Neck:     Thyroid: No thyroid mass, thyromegaly or thyroid tenderness.  Cardiovascular:     Rate and Rhythm: Normal rate and regular rhythm.     Pulses: Normal pulses.     Heart sounds: Normal heart sounds.  Pulmonary:     Effort: Pulmonary effort is normal.     Breath sounds: Normal breath sounds.  Musculoskeletal:     Cervical back: Normal range of motion and neck supple.     Right lower leg: No edema.     Left lower leg: No edema.  Lymphadenopathy:     Cervical: No cervical adenopathy.  Neurological:     Mental Status: She is alert and oriented to person, place, and time.     Lab Results    Component Value Date   WBC 6.4 08/25/2018   HGB 15.0 08/27/2018   HCT 44.0 08/27/2018   PLT 318 08/25/2018   GLUCOSE 101 (H) 04/17/2019   CHOL 187 04/17/2019   TRIG 62 04/17/2019   HDL 79 04/17/2019   LDLCALC 94 04/17/2019   ALT 14 01/13/2018   AST 16 01/13/2018   NA 138 04/17/2019   K 4.2 04/17/2019   CL 104 04/17/2019   CREATININE 0.77 04/17/2019   BUN 8 04/17/2019   CO2 25 04/17/2019   TSH 1.08 04/17/2019    DG Bone Density  Result Date: 07/18/2019 EXAM: DUAL X-RAY ABSORPTIOMETRY (DXA) FOR BONE MINERAL DENSITY IMPRESSION: Referring Physician:  [place] Your patient completed a BMD test using Lunar IDXA DXA system ( analysis version: 16 ) manufactured by [employer]. Technologist: AW PATIENT: Name: Phyllis Johnson, Phyllis Johnson Patient ID: [id number] Birth Date: 08/03/1951 Height: 58.0 in. Sex: Female Measured: 07/17/2019 Weight: 138.4 lbs. Indications: Bilateral Ovariectomy (65.51), Estrogen Deficient, Hysterectomy, Postmenopausal Fractures: Fibula Treatments: Multivitamin ASSESSMENT: The BMD measured at Femur Neck Right is 0.739 g/cm2 with a T-score of -2.1. This patient is considered osteopenic according to [facility]) criteria.  The scan quality is good. Site Region Measured Date Measured Age [age]A BMD Significant CHANGE T-score DualFemur Neck Right 07/17/2019    66.5         -2.1    0.739 g/cm2 AP Spine  L1-L4      07/17/2019    66.5         -1.7    0.971 g/cm2 DualFemur Total Mean 07/17/2019    66.5         -1.9    0.773 g/cm2 World Health Organization [facility]) criteria for post-menopausal, Caucasian Women: Normal       T-score at or above -1 SD Osteopenia   T-score between -1 and -2.5 SD Osteoporosis T-score at or below -2.5 SD RECOMMENDATION: 1. All patients should optimize calcium and vitamin D intake. 2. Consider FDA approved medical therapies in postmenopausal women and men aged 45 years and older, based on the following: a. A hip or vertebral (clinical or  morphometric) fracture b. T- score < or = -2.5 at the femoral neck or spine after appropriate evaluation to exclude secondary causes c. Low bone mass (T-score between -1.0 and -2.5 at the femoral neck or spine) and a 10 year probability of a hip fracture > or = 3% or a 10 year probability of a major osteoporosis-related fracture > or = 20% based on the [place]-adapted WHO algorithm d. Clinician judgment and/or patient preferences may indicate treatment for people with 10-year fracture probabilities above or below these levels FOLLOW-UP: Patients with diagnosis of osteoporosis or at high risk for fracture should have regular bone mineral density tests. For patients eligible for Medicare, routine testing is allowed once every 2 years. The testing frequency can be increased to one year for patients who have rapidly progressing disease, those who are receiving or discontinuing medical therapy to restore bone mass, or have additional risk factors. FRAX* 10-year Probability of Fracture Based on femoral neck BMD: DualFemur (Right) Major Osteoporotic Fracture: 5.2% Hip Fracture:                0.9% Population:                  [place] (Black) Risk Factors:                None *FRAX is a Materials engineer of the [employer] for Metabolic Bone Disease, a World Pharmacologist (WHO) [facility]. ASSESSMENT: The probability of a major osteoporotic fracture is 5.2 % within the next ten years. The probability of a hip fracture is 0.9 % within the next ten years. Electronically Signed   By: Marlaine Hind M.D.   On: 07/18/2019 16:50   MM 3D SCREEN BREAST BILATERAL  Result Date: 07/17/2019 CLINICAL DATA:  Screening. EXAM: DIGITAL SCREENING BILATERAL MAMMOGRAM WITH TOMO AND CAD COMPARISON:  Previous exam(s). ACR Breast Density Category b: There are scattered areas of fibroglandular density. FINDINGS: There are no findings suspicious for malignancy. Images were processed with CAD. IMPRESSION: No  mammographic evidence of malignancy. A result letter of this screening mammogram will be mailed directly to the patient. RECOMMENDATION: Screening mammogram in one year. (Code:SM-B-01Y) BI-RADS CATEGORY  1: Negative. Electronically Signed   By: Claudie Revering M.D.   On: 07/17/2019 14:49    Assessment & Plan:  This visit occurred during the SARS-CoV-2 public health emergency.  Safety protocols were in place, including screening questions prior to the visit, additional usage of staff PPE, and extensive cleaning of exam room while  observing appropriate contact time as indicated for disinfecting solutions.   Arushi was seen today for follow-up.  Diagnoses and all orders for this visit:  HTN (hypertension), benign -     Renal Function Panel  Hyperglycemia -     Hemoglobin A1c   I am having Phyllis Reason. Johnson "Phyllis Johnson" maintain her multivitamin, Omega-3 Fatty Acids (FISH OIL PO), valsartan, and amLODipine.  No orders of the defined types were placed in this encounter.   Problem List Items Addressed This Visit      Cardiovascular and Mediastinum   HTN (hypertension), benign - Primary   Relevant Orders   Renal Function Panel    Other Visit Diagnoses    Hyperglycemia       Relevant Orders   Hemoglobin A1c      Follow-up: Return in about 6 months (around 04/21/2020) for HTN and , hyperlipidemia (F2F).  Wilfred Lacy, NP

## 2019-10-23 NOTE — Patient Instructions (Signed)
Good to see you  Go to lab for blood draw. Maintain current medications.

## 2019-11-19 ENCOUNTER — Other Ambulatory Visit: Payer: Self-pay

## 2019-11-19 ENCOUNTER — Ambulatory Visit (INDEPENDENT_AMBULATORY_CARE_PROVIDER_SITE_OTHER): Payer: Medicare Other | Admitting: Nurse Practitioner

## 2019-11-19 ENCOUNTER — Encounter: Payer: Self-pay | Admitting: Nurse Practitioner

## 2019-11-19 VITALS — BP 140/92 | HR 56 | Temp 96.9°F | Ht 59.0 in | Wt 136.2 lb

## 2019-11-19 DIAGNOSIS — I10 Essential (primary) hypertension: Secondary | ICD-10-CM | POA: Diagnosis not present

## 2019-11-19 DIAGNOSIS — F411 Generalized anxiety disorder: Secondary | ICD-10-CM | POA: Diagnosis not present

## 2019-11-19 MED ORDER — HYDROXYZINE HCL 25 MG PO TABS: 25.0000 mg | ORAL_TABLET | Freq: Every evening | ORAL | 0 refills | Status: DC | PRN

## 2019-11-19 MED ORDER — VALSARTAN 40 MG PO TABS: 40.0000 mg | ORAL_TABLET | Freq: Every day | ORAL | 5 refills | Status: DC

## 2019-11-19 MED ORDER — AMLODIPINE BESYLATE 5 MG PO TABS: 5.0000 mg | ORAL_TABLET | Freq: Every day | ORAL | 3 refills | Status: DC

## 2019-11-19 NOTE — Progress Notes (Signed)
Subjective:  Patient ID: Phyllis Johnson, female    DOB: 10/19/1950  Age: 69 y.o. MRN: [id number]  CC: Follow-up (BP reading high at home--took valsartan 80 mg 11/17/2018 and today/)  HPI  Phyllis Johnson is concerned about elevated BP in last 2days. AM (133/94, 139/94, 140/96, 132/91, 139/92, 153/100, 160/111) PM (110/80, 124/89, 120/85, 144/94, 112/79, 146/97, 143/98) She also states her son who lives in [place], was diagnosed with COVID 6days ago. She admits to increased anxiety and inadequate sleep for last 6days. She denies any chest pain or SOB or headache or dizziness or blurry vision or LE edema. She took 80mg  of valsartan in AM for last 2days and 5mg  of amlodipine in HS.  Reviewed past Medical, Social and Family history today.  Outpatient Medications Prior to Visit  Medication Sig Dispense Refill  . Multiple Vitamin (MULTIVITAMIN) tablet Take 1 tablet by mouth daily.    . Omega-3 Fatty Acids (FISH OIL PO) Take by mouth.    Marland Kitchen amLODipine (NORVASC) 5 MG tablet Take 1 tablet (5 mg total) by mouth at bedtime. 30 tablet 5  . valsartan (DIOVAN) 40 MG tablet Take 2 tablets (80 mg total) by mouth daily. 60 tablet 5   No facility-administered medications prior to visit.   ROS See HPI  Objective:  BP (!) 140/92   Pulse (!) 56   Temp (!) 96.9 F (36.1 C) (Tympanic)   Ht 4\' 11"  (1.499 m)   Wt 136 lb 3.2 oz (61.8 kg)   SpO2 97%   BMI 27.51 kg/m   BP Readings from Last 3 Encounters:  11/19/19 (!) 140/92  10/23/19 136/82  07/25/19 134/85    Wt Readings from Last 3 Encounters:  11/19/19 136 lb 3.2 oz (61.8 kg)  10/23/19 139 lb 9.6 oz (63.3 kg)  06/27/19 139 lb (63 kg)    Physical Exam Vitals reviewed.  Cardiovascular:     Rate and Rhythm: Normal rate and regular rhythm.     Pulses: Normal pulses.     Heart sounds: Normal heart sounds.  Pulmonary:     Effort: Pulmonary effort is normal.     Breath sounds: Normal breath sounds.  Musculoskeletal:     Right lower leg: No  edema.     Left lower leg: No edema.  Neurological:     Mental Status: She is alert and oriented to person, place, and time.    Lab Results  Component Value Date   WBC 6.4 08/25/2018   HGB 15.0 08/27/2018   HCT 44.0 08/27/2018   PLT 318 08/25/2018   GLUCOSE 84 10/23/2019   CHOL 187 04/17/2019   TRIG 62 04/17/2019   HDL 79 04/17/2019   LDLCALC 94 04/17/2019   ALT 14 01/13/2018   AST 16 01/13/2018   NA 137 10/23/2019   K 4.2 10/23/2019   CL 100 10/23/2019   CREATININE 0.72 10/23/2019   BUN 10 10/23/2019   CO2 29 10/23/2019   TSH 1.08 04/17/2019   HGBA1C 6.0 10/23/2019   Assessment & Plan:  This visit occurred during the SARS-CoV-2 public health emergency.  Safety protocols were in place, including screening questions prior to the visit, additional usage of staff PPE, and extensive cleaning of exam room while observing appropriate contact time as indicated for disinfecting solutions.   Phyllis Johnson was seen today for follow-up.  Diagnoses and all orders for this visit:  HTN (hypertension), benign -     amLODipine (NORVASC) 5 MG tablet; Take 1 tablet (5 mg total)  by mouth at bedtime.  GAD (generalized anxiety disorder) -     hydrOXYzine (ATARAX/VISTARIL) 25 MG tablet; Take 1 tablet (25 mg total) by mouth at bedtime as needed for anxiety.  Essential hypertension -     valsartan (DIOVAN) 40 MG tablet; Take 1-2 tablets (40-80 mg total) by mouth daily. 40mg  if BP<140/80 and 80mg  if BP>150/90   I have changed Phyllis Johnson "Phyllis Johnson"'s valsartan. I am also having her start on hydrOXYzine. Additionally, I am having her maintain her multivitamin, Omega-3 Fatty Acids (FISH OIL PO), and amLODipine.  Meds ordered this encounter  Medications  . hydrOXYzine (ATARAX/VISTARIL) 25 MG tablet    Sig: Take 1 tablet (25 mg total) by mouth at bedtime as needed for anxiety.    Dispense:  14 tablet    Refill:  0    Order Specific Question:   Supervising Provider    Answer:   Lucille Passy [[id number]]  . valsartan (DIOVAN) 40 MG tablet    Sig: Take 1-2 tablets (40-80 mg total) by mouth daily. 40mg  if BP<140/80 and 80mg  if BP>150/90    Dispense:  180 tablet    Refill:  5    Order Specific Question:   Supervising Provider    Answer:   Lucille Passy [[id number]]  . amLODipine (NORVASC) 5 MG tablet    Sig: Take 1 tablet (5 mg total) by mouth at bedtime.    Dispense:  90 tablet    Refill:  3    Order Specific Question:   Supervising Provider    Answer:   Lucille Passy [[id number]]    Problem List Items Addressed This Visit      Cardiovascular and Mediastinum   HTN (hypertension), benign - Primary   Relevant Medications   valsartan (DIOVAN) 40 MG tablet   amLODipine (NORVASC) 5 MG tablet     Other   GAD (generalized anxiety disorder)   Relevant Medications   hydrOXYzine (ATARAX/VISTARIL) 25 MG tablet    Other Visit Diagnoses    Essential hypertension       Relevant Medications   valsartan (DIOVAN) 40 MG tablet   amLODipine (NORVASC) 5 MG tablet      Follow-up: Return if symptoms worsen or fail to improve.  Wilfred Lacy, NP

## 2019-11-19 NOTE — Patient Instructions (Signed)
Maintain current BP medications with parameters previously provided  Take famotidine 10mg  BID and claritin 10mg  daily x 7days then stop  You use vistaril as needed once you have completed famotidine and Claritin regimen.

## 2020-01-07 ENCOUNTER — Ambulatory Visit: Payer: Medicare Other

## 2020-01-10 ENCOUNTER — Ambulatory Visit: Payer: Medicare Other

## 2020-01-10 ENCOUNTER — Encounter: Payer: Self-pay | Admitting: Nurse Practitioner

## 2020-01-30 ENCOUNTER — Ambulatory Visit: Payer: Medicare Other | Admitting: *Deleted

## 2020-02-05 ENCOUNTER — Ambulatory Visit: Payer: Medicare Other

## 2020-02-28 ENCOUNTER — Encounter: Payer: Self-pay | Admitting: Nurse Practitioner

## 2020-02-29 ENCOUNTER — Encounter: Payer: Self-pay | Admitting: Nurse Practitioner

## 2020-04-04 ENCOUNTER — Encounter: Payer: Self-pay | Admitting: Nurse Practitioner

## 2020-06-17 DIAGNOSIS — Z20822 Contact with and (suspected) exposure to covid-19: Secondary | ICD-10-CM | POA: Diagnosis not present

## 2020-06-24 ENCOUNTER — Telehealth: Payer: Self-pay | Admitting: Nurse Practitioner

## 2020-06-24 NOTE — Telephone Encounter (Signed)
Left message for patient to schedule Annual Wellness Visit.  Please schedule with Nurse Health Advisor Martha Stanley, RN at [facility] [place]  °

## 2020-06-25 ENCOUNTER — Other Ambulatory Visit: Payer: Self-pay | Admitting: Nurse Practitioner

## 2020-06-25 DIAGNOSIS — Z1231 Encounter for screening mammogram for malignant neoplasm of breast: Secondary | ICD-10-CM

## 2020-07-03 ENCOUNTER — Ambulatory Visit (INDEPENDENT_AMBULATORY_CARE_PROVIDER_SITE_OTHER): Payer: Medicare Other

## 2020-07-03 VITALS — Ht 59.0 in | Wt 136.0 lb

## 2020-07-03 DIAGNOSIS — Z0000 Encounter for general adult medical examination without abnormal findings: Secondary | ICD-10-CM | POA: Diagnosis not present

## 2020-07-03 NOTE — Progress Notes (Signed)
Subjective:   Tempestt Silba is a 69 y.o. female who presents for Medicare Annual (Subsequent) preventive examination.  .I connected with [place] today by telephone and verified that I am speaking with the correct person using two identifiers. Location patient: home Location provider: work Persons participating in the virtual visit: patient, Marine scientist.    I discussed the limitations, risks, security and privacy concerns of performing an evaluation and management service by telephone and the availability of in person appointments. I also discussed with the patient that there may be a patient responsible charge related to this service. The patient expressed understanding and verbally consented to this telephonic visit.    Interactive audio and video telecommunications were attempted between this provider and patient, however failed, due to patient having technical difficulties OR patient did not have access to video capability.  We continued and completed visit with audio only.  Some vital signs may be absent or patient reported.   Time Spent with patient on telephone encounter: 20 minutes  Review of Systems     Cardiac Risk Factors include: advanced age (>78men, >21 women);hypertension     Objective:    Today's Vitals   07/03/20 1503  Weight: 136 lb (61.7 kg)  Height: 4\' 11"  (1.499 m)   Body mass index is 27.47 kg/m.  Advanced Directives 07/03/2020 01/24/2019 08/27/2018 08/25/2018 11/01/2016 09/18/2016  Does Patient Have a Medical Advance Directive? No No No No No No  Would patient like information on creating a medical advance directive? Yes (MAU/Ambulatory/Procedural Areas - Information given) No - Patient declined No - Patient declined No - Patient declined - No - Patient declined    Current Medications (verified) Outpatient Encounter Medications as of 07/03/2020  Medication Sig  . amLODipine (NORVASC) 5 MG tablet Take 1 tablet (5 mg total) by mouth at bedtime.  .  hydrOXYzine (ATARAX/VISTARIL) 25 MG tablet Take 1 tablet (25 mg total) by mouth at bedtime as needed for anxiety.  . Multiple Vitamin (MULTIVITAMIN) tablet Take 1 tablet by mouth daily.  . Omega-3 Fatty Acids (FISH OIL PO) Take by mouth.  . valsartan (DIOVAN) 40 MG tablet Take 1-2 tablets (40-80 mg total) by mouth daily. 40mg  if BP<140/80 and 80mg  if BP>150/90   No facility-administered encounter medications on file as of 07/03/2020.    Allergies (verified) Patient has no known allergies.   History: Past Medical History:  Diagnosis Date  . Allergy   . Anxiety    under control with prayer and exercise.  . Arrhythmia    "irregular heart beat" during stress test in [place]  . Chronic bronchitis ([place])   . Depression    in remission with counselling  . Hypertension    Past Surgical History:  Procedure Laterality Date  . ABDOMINAL HYSTERECTOMY     with oophrectomy, secondary to large uterine fibriods  . BREAST EXCISIONAL BIOPSY Right    Family History  Problem Relation Age of Onset  . Hypertension Father   . Stroke Father        hemorrhagic  . Heart disease Father   . Heart attack Father 28  . COPD Sister   . Cancer Sister        lung secondary to tobacco use  . Fibroids Sister        uterine  . Cancer Cousin        breast  . Alcohol abuse Mother    Social History   Socioeconomic History  . Marital status: Married    Spouse  name: Not on file  . Number of children: 4  . Years of education: Not on file  . Highest education level: Not on file  Occupational History  . Occupation: retired  Tobacco Use  . Smoking status: Former Smoker    Packs/day: 1.50    Years: 30.00    Pack years: 45.00    Types: Cigarettes    Quit date: 2008    Years since quitting: 13.7  . Smokeless tobacco: Never Used  Substance and Sexual Activity  . Alcohol use: No  . Drug use: No    Comment: Clean since 1993; previous crack-cocaine  . Sexual activity: Not Currently  Other Topics Concern    . Not on file  Social History Narrative  . Not on file   Social Determinants of Health   Financial Resource Strain: Low Risk   . Difficulty of Paying Living Expenses: Not hard at all  Food Insecurity: No Food Insecurity  . Worried About Charity fundraiser in the Last Year: Never true  . Ran Out of Food in the Last Year: Never true  Transportation Needs: No Transportation Needs  . Lack of Transportation (Medical): No  . Lack of Transportation (Non-Medical): No  Physical Activity: Sufficiently Active  . Days of Exercise per Week: 5 days  . Minutes of Exercise per Session: 50 min  Stress: No Stress Concern Present  . Feeling of Stress : Not at all  Social Connections: Moderately Integrated  . Frequency of Communication with Friends and Family: Once a week  . Frequency of Social Gatherings with Friends and Family: Once a week  . Attends Religious Services: 1 to 4 times per year  . Active Member of Clubs or Organizations: Yes  . Attends Archivist Meetings: 1 to 4 times per year  . Marital Status: Married    Tobacco Counseling Counseling given: Not Answered   Clinical Intake:  Pre-visit preparation completed: Yes  Pain : No/denies pain     Nutritional Status: BMI 25 -29 Overweight Nutritional Risks: None Diabetes: No  How often do you need to have someone help you when you read instructions, pamphlets, or other written materials from your doctor or pharmacy?: 1 - Never What is the last grade level you completed in school?: 12th grade  Diabetic?No  Interpreter Needed?: No  Information entered by :: Caroleen Hamman LPN   Activities of Daily Living In your present state of health, do you have any difficulty performing the following activities: 07/03/2020  Hearing? N  Vision? N  Difficulty concentrating or making decisions? N  Walking or climbing stairs? N  Dressing or bathing? N  Doing errands, shopping? N  Preparing Food and eating ? N  Using the  Toilet? N  In the past six months, have you accidently leaked urine? N  Do you have problems with loss of bowel control? N  Managing your Medications? N  Managing your Finances? N  Housekeeping or managing your Housekeeping? N  Some recent data might be hidden    Patient Care Team: Nche, Charlene Brooke, NP as PCP - General (Internal Medicine)  Indicate any recent Medical Services you may have received from other than [facility] providers in the past year (date may be approximate).     Assessment:   This is a routine wellness examination for Arianne.  Hearing/Vision screen  Hearing Screening   125Hz  250Hz  500Hz  1000Hz  2000Hz  3000Hz  4000Hz  6000Hz  8000Hz   Right ear:  Left ear:           Comments: No issues  Vision Screening Comments: Reading glasses Last eye exam 2020  Dietary issues and exercise activities discussed: Current Exercise Habits: Home exercise routine, Type of exercise: walking, Time (Minutes): 50, Frequency (Times/Week): 5, Weekly Exercise (Minutes/Week): 250, Intensity: Mild  Goals    . Exercise 150 min/wk Moderate Activity     Stay consistent with exercise and healthy diet.      Depression Screen PHQ 2/9 Scores 07/03/2020 07/25/2019 01/24/2019 12/08/2018 10/21/2017 10/21/2017  PHQ - 2 Score 0 0 0 0 0 0  PHQ- 9 Score - 0 - - - -    Fall Risk Fall Risk  07/03/2020 01/24/2019 12/08/2018 10/21/2017 10/21/2017  Falls in the past year? 0 0 0 No Yes  Number falls in past yr: 0 - - - 1  Injury with Fall? 0 - - - No  Follow up Falls prevention discussed - - - -    Any stairs in or around the home? Yes  If so, are there any without handrails? No  Home free of loose throw rugs in walkways, pet beds, electrical cords, etc? Yes  Adequate lighting in your home to reduce risk of falls? Yes   ASSISTIVE DEVICES UTILIZED TO PREVENT FALLS:  Life alert? No  Use of a cane, walker or w/c? No  Grab bars in the bathroom? Yes  Shower chair or bench in shower? No  Elevated  toilet seat or a handicapped toilet? No   TIMED UP AND GO:  Was the test performed? No . Phone visit  Cognitive Function:No cognitive impairment noted.  MMSE - Mini Mental State Exam 10/21/2017  Orientation to time 5  Orientation to Place 5  Registration 3  Attention/ Calculation 5  Recall 3  Language- name 2 objects 2  Language- repeat 1  Language- follow 3 step command 3  Language- read & follow direction 1  Write a sentence 1  Copy design 1  Total score 30        Immunizations Immunization History  Administered Date(s) Administered  . Moderna SARS-COVID-2 Vaccination 03/04/2020, 04/04/2020    TDAP status: Due, Education has been provided regarding the importance of this vaccine. Advised may receive this vaccine at local pharmacy or Health Dept. Aware to provide a copy of the vaccination record if obtained from local pharmacy or Health Dept. Verbalized acceptance and understanding.   Flu Vaccine status: Declined, Education has been provided regarding the importance of this vaccine but patient still declined. Advised may receive this vaccine at local pharmacy or Health Dept. Aware to provide a copy of the vaccination record if obtained from local pharmacy or Health Dept. Verbalized acceptance and understanding.   Pneumococcal vaccine status: Declined,  Education has been provided regarding the importance of this vaccine but patient still declined. Advised may receive this vaccine at local pharmacy or Health Dept. Aware to provide a copy of the vaccination record if obtained from local pharmacy or Health Dept. Verbalized acceptance and understanding.    Covid-19 vaccine status: Completed vaccines  Qualifies for Shingles Vaccine? Yes   Zostavax completed No   Shingrix Completed?: No.    Education has been provided regarding the importance of this vaccine. Patient has been advised to call insurance company to determine out of pocket expense if they have not yet received this  vaccine. Advised may also receive vaccine at local pharmacy or Health Dept. Verbalized acceptance and understanding.  Screening Tests Health Maintenance  Topic Date Due  . INFLUENZA VACCINE  01/08/2021 (Originally 05/11/2020)  . TETANUS/TDAP  07/03/2021 (Originally 01/12/1970)  . PNA vac Low Risk Adult (1 of 2 - PCV13) 07/03/2021 (Originally 01/11/2018)  . COLONOSCOPY  10/13/2020  . MAMMOGRAM  07/16/2021  . DEXA SCAN  Completed  . COVID-19 Vaccine  Completed  . Hepatitis C Screening  Completed    Health Maintenance  There are no preventive care reminders to display for this patient.  Colorectal cancer screening: Completed 10/13/2010. Repeat every 10 years   Mammogram status: Completed 07/17/2019. Repeat every year   Bone Density status: Completed 07/17/2019. Results reflect: Bone density results: OSTEOPENIA. Repeat every 2 years.  Lung Cancer Screening: (Low Dose CT Chest recommended if Age 68-80 years, 30 pack-year currently smoking OR have quit w/in 15years.) does not qualify.    Additional Screening:  Hepatitis C Screening: Completed 09/29/2016  Vision Screening: Recommended annual ophthalmology exams for early detection of glaucoma and other disorders of the eye. Is the patient up to date with their annual eye exam?  Yes  Who is the provider or what is the name of the office in which the patient attends annual eye exams? Unsure of name   Dental Screening: Recommended annual dental exams for proper oral hygiene  Community Resource Referral / Chronic Care Management: CRR required this visit?  No   CCM required this visit?  No      Plan:     I have personally reviewed and noted the following in the patient's chart:   . Medical and social history . Use of alcohol, tobacco or illicit drugs  . Current medications and supplements . Functional ability and status . Nutritional status . Physical activity . Advanced directives . List of other physicians . Hospitalizations,  surgeries, and ER visits in previous 12 months . Vitals . Screenings to include cognitive, depression, and falls . Referrals and appointments  In addition, I have reviewed and discussed with patient certain preventive protocols, quality metrics, and best practice recommendations. A written personalized care plan for preventive services as well as general preventive health recommendations were provided to patient.  Due to this being a telephonic visit, the after visit summary with patients personalized plan was offered to patient via mail or my-chart. Patient would like to access on my-chart.  Marta Antu, LPN   [id number]  Nurse Health Advisor  Nurse Notes: None

## 2020-07-03 NOTE — Patient Instructions (Signed)
Phyllis Johnson , Thank you for taking time to complete your Medicare Wellness Visit. I appreciate your ongoing commitment to your health goals. Please review the following plan we discussed and let me know if I can assist you in the future.   Screening recommendations/referrals: Colonoscopy: Completed 10/13/2010- Due 10/13/2020 Mammogram: Completed 07/17/2019- Scheduled for 07/23/2020 Bone Density: Completed 07/17/2019- Due 07/16/2021 Recommended yearly ophthalmology/optometry visit for glaucoma screening and checkup Recommended yearly dental visit for hygiene and checkup  Vaccinations: Influenza vaccine: Declined Pneumococcal vaccine: Declined Tdap vaccine: Declined Shingles vaccine: Declined  Covid-19:Declined  Advanced directives: Information mailed today.  Conditions/risks identified: See problem list  Next appointment: Follow up in one year for your annual wellness visit    Preventive Care 65 Years and Older, Female Preventive care refers to lifestyle choices and visits with your health care provider that can promote health and wellness. What does preventive care include?  A yearly physical exam. This is also called an annual well check.  Dental exams once or twice a year.  Routine eye exams. Ask your health care provider how often you should have your eyes checked.  Personal lifestyle choices, including:  Daily care of your teeth and gums.  Regular physical activity.  Eating a healthy diet.  Avoiding tobacco and drug use.  Limiting alcohol use.  Practicing safe sex.  Taking low-dose aspirin every day.  Taking vitamin and mineral supplements as recommended by your health care provider. What happens during an annual well check? The services and screenings done by your health care provider during your annual well check will depend on your age, overall health, lifestyle risk factors, and family history of disease. Counseling  Your health care provider may ask you  questions about your:  Alcohol use.  Tobacco use.  Drug use.  Emotional well-being.  Home and relationship well-being.  Sexual activity.  Eating habits.  History of falls.  Memory and ability to understand (cognition).  Work and work Statistician.  Reproductive health. Screening  You may have the following tests or measurements:  Height, weight, and BMI.  Blood pressure.  Lipid and cholesterol levels. These may be checked every 5 years, or more frequently if you are over 76 years old.  Skin check.  Lung cancer screening. You may have this screening every year starting at age 58 if you have a 30-pack-year history of smoking and currently smoke or have quit within the past 15 years.  Fecal occult blood test (FOBT) of the stool. You may have this test every year starting at age 52.  Flexible sigmoidoscopy or colonoscopy. You may have a sigmoidoscopy every 5 years or a colonoscopy every 10 years starting at age 54.  Hepatitis C blood test.  Hepatitis B blood test.  Sexually transmitted disease (STD) testing.  Diabetes screening. This is done by checking your blood sugar (glucose) after you have not eaten for a while (fasting). You may have this done every 1-3 years.  Bone density scan. This is done to screen for osteoporosis. You may have this done starting at age 59.  Mammogram. This may be done every 1-2 years. Talk to your health care provider about how often you should have regular mammograms. Talk with your health care provider about your test results, treatment options, and if necessary, the need for more tests. Vaccines  Your health care provider may recommend certain vaccines, such as:  Influenza vaccine. This is recommended every year.  Tetanus, diphtheria, and acellular pertussis (Tdap, Td) vaccine. You may need  a Td booster every 10 years.  Zoster vaccine. You may need this after age 51.  Pneumococcal 13-valent conjugate (PCV13) vaccine. One dose is  recommended after age 98.  Pneumococcal polysaccharide (PPSV23) vaccine. One dose is recommended after age 64. Talk to your health care provider about which screenings and vaccines you need and how often you need them. This information is not intended to replace advice given to you by your health care provider. Make sure you discuss any questions you have with your health care provider. Document Released: 10/24/2015 Document Revised: 06/16/2016 Document Reviewed: 07/29/2015 Elsevier Interactive Patient Education  2017 [place] Prevention in the Home Falls can cause injuries. They can happen to people of all ages. There are many things you can do to make your home safe and to help prevent falls. What can I do on the outside of my home?  Regularly fix the edges of walkways and driveways and fix any cracks.  Remove anything that might make you trip as you walk through a door, such as a raised step or threshold.  Trim any bushes or trees on the path to your home.  Use bright outdoor lighting.  Clear any walking paths of anything that might make someone trip, such as rocks or tools.  Regularly check to see if handrails are loose or broken. Make sure that both sides of any steps have handrails.  Any raised decks and porches should have guardrails on the edges.  Have any leaves, snow, or ice cleared regularly.  Use sand or salt on walking paths during winter.  Clean up any spills in your garage right away. This includes oil or grease spills. What can I do in the bathroom?  Use night lights.  Install grab bars by the toilet and in the tub and shower. Do not use towel bars as grab bars.  Use non-skid mats or decals in the tub or shower.  If you need to sit down in the shower, use a plastic, non-slip stool.  Keep the floor dry. Clean up any water that spills on the floor as soon as it happens.  Remove soap buildup in the tub or shower regularly.  Attach bath mats  securely with double-sided non-slip rug tape.  Do not have throw rugs and other things on the floor that can make you trip. What can I do in the bedroom?  Use night lights.  Make sure that you have a light by your bed that is easy to reach.  Do not use any sheets or blankets that are too big for your bed. They should not hang down onto the floor.  Have a firm chair that has side arms. You can use this for support while you get dressed.  Do not have throw rugs and other things on the floor that can make you trip. What can I do in the kitchen?  Clean up any spills right away.  Avoid walking on wet floors.  Keep items that you use a lot in easy-to-reach places.  If you need to reach something above you, use a strong step stool that has a grab bar.  Keep electrical cords out of the way.  Do not use floor polish or wax that makes floors slippery. If you must use wax, use non-skid floor wax.  Do not have throw rugs and other things on the floor that can make you trip. What can I do with my stairs?  Do not leave any items on the  stairs.  Make sure that there are handrails on both sides of the stairs and use them. Fix handrails that are broken or loose. Make sure that handrails are as long as the stairways.  Check any carpeting to make sure that it is firmly attached to the stairs. Fix any carpet that is loose or worn.  Avoid having throw rugs at the top or bottom of the stairs. If you do have throw rugs, attach them to the floor with carpet tape.  Make sure that you have a light switch at the top of the stairs and the bottom of the stairs. If you do not have them, ask someone to add them for you. What else can I do to help prevent falls?  Wear shoes that:  Do not have high heels.  Have rubber bottoms.  Are comfortable and fit you well.  Are closed at the toe. Do not wear sandals.  If you use a stepladder:  Make sure that it is fully opened. Do not climb a closed  stepladder.  Make sure that both sides of the stepladder are locked into place.  Ask someone to hold it for you, if possible.  Clearly mark and make sure that you can see:  Any grab bars or handrails.  First and last steps.  Where the edge of each step is.  Use tools that help you move around (mobility aids) if they are needed. These include:  Canes.  Walkers.  Scooters.  Crutches.  Turn on the lights when you go into a dark area. Replace any light bulbs as soon as they burn out.  Set up your furniture so you have a clear path. Avoid moving your furniture around.  If any of your floors are uneven, fix them.  If there are any pets around you, be aware of where they are.  Review your medicines with your doctor. Some medicines can make you feel dizzy. This can increase your chance of falling. Ask your doctor what other things that you can do to help prevent falls. This information is not intended to replace advice given to you by your health care provider. Make sure you discuss any questions you have with your health care provider. Document Released: 07/24/2009 Document Revised: 03/04/2016 Document Reviewed: 11/01/2014 Elsevier Interactive Patient Education  2017 [employer].

## 2020-07-23 ENCOUNTER — Ambulatory Visit
Admission: RE | Admit: 2020-07-23 | Discharge: 2020-07-23 | Disposition: A | Discharge status: Home / Self Care | Payer: Medicare Other | Source: Ambulatory Visit | Attending: Nurse Practitioner | Admitting: Nurse Practitioner

## 2020-07-23 ENCOUNTER — Other Ambulatory Visit: Payer: Self-pay

## 2020-07-23 DIAGNOSIS — Z1231 Encounter for screening mammogram for malignant neoplasm of breast: Secondary | ICD-10-CM | POA: Diagnosis not present

## 2020-09-01 ENCOUNTER — Encounter: Payer: Self-pay | Admitting: Nurse Practitioner

## 2020-09-01 ENCOUNTER — Ambulatory Visit (INDEPENDENT_AMBULATORY_CARE_PROVIDER_SITE_OTHER): Payer: Medicare Other | Admitting: Nurse Practitioner

## 2020-09-01 ENCOUNTER — Other Ambulatory Visit: Payer: Self-pay

## 2020-09-01 VITALS — BP 118/68 | HR 58 | Temp 96.9°F | Ht 59.0 in | Wt 138.6 lb

## 2020-09-01 DIAGNOSIS — M940 Chondrocostal junction syndrome [Tietze]: Secondary | ICD-10-CM | POA: Diagnosis not present

## 2020-09-01 NOTE — Progress Notes (Signed)
   Subjective:  Patient ID: Phyllis Johnson, female    DOB: [birth year]  Age: 69 y.o. MRN: [id number]  CC: Acute Visit (Pt states Thursday she was experiencing chest pressure but it was only for that day and she has not felt it since. Pt states she thinks to pressure came from working out at the gym. Declines flu vaccine.)  Chest Pain  This is a new problem. The current episode started in the past 7 days. The onset quality is sudden. The problem occurs constantly. The problem has been resolved. The pain is present in the substernal region. The pain is moderate. The quality of the pain is described as dull. The pain does not radiate. Pertinent negatives include no back pain, cough, exertional chest pressure, irregular heartbeat, malaise/fatigue, PND or shortness of breath. The pain is aggravated by lifting. She has tried rest for the symptoms. The treatment provided significant relief.  Her past medical history is significant for anxiety/panic attacks.   Chest wall pain after lifting weights: 75lbs Resolved with rest.  Reviewed past Medical, Social and Family history today.  Outpatient Medications Prior to Visit  Medication Sig Dispense Refill  . amLODipine (NORVASC) 5 MG tablet Take 1 tablet (5 mg total) by mouth at bedtime. 90 tablet 3  . hydrOXYzine (ATARAX/VISTARIL) 25 MG tablet Take 1 tablet (25 mg total) by mouth at bedtime as needed for anxiety. 14 tablet 0  . Multiple Vitamin (MULTIVITAMIN) tablet Take 1 tablet by mouth daily.    . Omega-3 Fatty Acids (FISH OIL PO) Take by mouth.    . valsartan (DIOVAN) 40 MG tablet Take 1-2 tablets (40-80 mg total) by mouth daily. 40mg  if BP<140/80 and 80mg  if BP>150/90 180 tablet 5   No facility-administered medications prior to visit.    ROS See HPI  Objective:  BP 118/68 (BP Location: Left Arm, Patient Position: Sitting, Cuff Size: Normal)   Pulse (!) 58   Temp (!) 96.9 F (36.1 C) (Temporal)   Ht 4\' 11"  (1.499 m)   Wt 138 lb 9.6 oz  (62.9 kg)   SpO2 97%   BMI 27.99 kg/m   Physical Exam Cardiovascular:     Rate and Rhythm: Normal rate and regular rhythm.     Pulses: Normal pulses.     Heart sounds: Normal heart sounds.  Pulmonary:     Effort: Pulmonary effort is normal.     Breath sounds: Normal breath sounds.  Skin:    General: Skin is warm and dry.     Findings: No erythema or rash.  Neurological:     Mental Status: She is alert.    Assessment & Plan:  This visit occurred during the SARS-CoV-2 public health emergency.  Safety protocols were in place, including screening questions prior to the visit, additional usage of staff PPE, and extensive cleaning of exam room while observing appropriate contact time as indicated for disinfecting solutions.   Nachelle was seen today for acute visit.  Diagnoses and all orders for this visit:  Costochondritis, acute   Problem List Items Addressed This Visit    None    Visit Diagnoses    Costochondritis, acute    -  Primary      Follow-up: No follow-ups on file.  Wilfred Lacy, NP

## 2020-09-01 NOTE — Patient Instructions (Signed)
Costochondritis Costochondritis is swelling and irritation (inflammation) of the tissue (cartilage) that connects your ribs to your breastbone (sternum). This causes pain in the front of your chest. Usually, the pain:  Starts gradually.  Is in more than one rib. This condition usually goes away on its own over time. Follow these instructions at home:  Do not do anything that makes your pain worse.  If directed, put ice on the painful area: ? Put ice in a plastic bag. ? Place a towel between your skin and the bag. ? Leave the ice on for 20 minutes, 2-3 times a day.  If directed, put heat on the affected area as often as told by your doctor. Use the heat source that your doctor tells you to use, such as a moist heat pack or a heating pad. ? Place a towel between your skin and the heat source. ? Leave the heat on for 20-30 minutes. ? Take off the heat if your skin turns bright red. This is very important if you cannot feel pain, heat, or cold. You may have a greater risk of getting burned.  Take over-the-counter and prescription medicines only as told by your doctor.  Return to your normal activities as told by your doctor. Ask your doctor what activities are safe for you.  Keep all follow-up visits as told by your doctor. This is important. Contact a doctor if:  You have chills or a fever.  Your pain does not go away or it gets worse.  You have a cough that does not go away. Get help right away if:  You are short of breath. This information is not intended to replace advice given to you by your health care provider. Make sure you discuss any questions you have with your health care provider. Document Revised: 10/12/2017 Document Reviewed: 01/21/2016 Elsevier Patient Education  2020 [employer]vier Inc.  

## 2020-09-02 ENCOUNTER — Encounter: Payer: Self-pay | Admitting: Nurse Practitioner

## 2020-09-08 ENCOUNTER — Encounter: Payer: Self-pay | Admitting: Nurse Practitioner

## 2020-10-15 DIAGNOSIS — R059 Cough, unspecified: Secondary | ICD-10-CM | POA: Diagnosis not present

## 2020-12-27 ENCOUNTER — Other Ambulatory Visit: Payer: Self-pay | Admitting: Nurse Practitioner

## 2020-12-27 DIAGNOSIS — I10 Essential (primary) hypertension: Secondary | ICD-10-CM

## 2021-01-29 ENCOUNTER — Other Ambulatory Visit: Payer: Self-pay | Admitting: Nurse Practitioner

## 2021-01-29 DIAGNOSIS — I10 Essential (primary) hypertension: Secondary | ICD-10-CM

## 2021-02-02 ENCOUNTER — Other Ambulatory Visit: Payer: Self-pay | Admitting: Nurse Practitioner

## 2021-02-02 DIAGNOSIS — I10 Essential (primary) hypertension: Secondary | ICD-10-CM

## 2021-02-04 NOTE — Telephone Encounter (Signed)
Called and left patient a message to call and schedule an appointment.

## 2021-05-02 ENCOUNTER — Other Ambulatory Visit: Payer: Self-pay | Admitting: Nurse Practitioner

## 2021-05-02 DIAGNOSIS — I10 Essential (primary) hypertension: Secondary | ICD-10-CM

## 2021-05-04 ENCOUNTER — Encounter: Payer: Self-pay | Admitting: Gastroenterology

## 2021-05-29 ENCOUNTER — Emergency Department (HOSPITAL_COMMUNITY)
Admission: EM | Admit: 2021-05-29 | Discharge: 2021-05-29 | Disposition: A | Discharge status: Home / Self Care | Payer: Medicare Other | Attending: Emergency Medicine | Admitting: Emergency Medicine

## 2021-05-29 ENCOUNTER — Emergency Department (HOSPITAL_COMMUNITY): Payer: Medicare Other

## 2021-05-29 ENCOUNTER — Other Ambulatory Visit: Payer: Self-pay

## 2021-05-29 ENCOUNTER — Encounter (HOSPITAL_COMMUNITY): Payer: Self-pay | Admitting: Emergency Medicine

## 2021-05-29 DIAGNOSIS — R42 Dizziness and giddiness: Secondary | ICD-10-CM | POA: Insufficient documentation

## 2021-05-29 DIAGNOSIS — I10 Essential (primary) hypertension: Secondary | ICD-10-CM | POA: Insufficient documentation

## 2021-05-29 DIAGNOSIS — Z79899 Other long term (current) drug therapy: Secondary | ICD-10-CM | POA: Insufficient documentation

## 2021-05-29 DIAGNOSIS — Z87891 Personal history of nicotine dependence: Secondary | ICD-10-CM | POA: Diagnosis not present

## 2021-05-29 DIAGNOSIS — I959 Hypotension, unspecified: Secondary | ICD-10-CM | POA: Diagnosis not present

## 2021-05-29 DIAGNOSIS — R059 Cough, unspecified: Secondary | ICD-10-CM | POA: Insufficient documentation

## 2021-05-29 DIAGNOSIS — R0989 Other specified symptoms and signs involving the circulatory and respiratory systems: Secondary | ICD-10-CM

## 2021-05-29 LAB — URINALYSIS, ROUTINE W REFLEX MICROSCOPIC
Bilirubin Urine: NEGATIVE
Glucose, UA: NEGATIVE mg/dL
Hgb urine dipstick: NEGATIVE
Ketones, ur: NEGATIVE mg/dL
Nitrite: NEGATIVE
Protein, ur: NEGATIVE mg/dL
Specific Gravity, Urine: 1.02 (ref 1.005–1.030)
pH: 6.5 (ref 5.0–8.0)

## 2021-05-29 LAB — COMPREHENSIVE METABOLIC PANEL
ALT: 11 U/L (ref 0–44)
AST: 16 U/L (ref 15–41)
Albumin: 4.2 g/dL (ref 3.5–5.0)
Alkaline Phosphatase: 94 U/L (ref 38–126)
Anion gap: 9 (ref 5–15)
BUN: 7 mg/dL — ABNORMAL LOW (ref 8–23)
CO2: 24 mmol/L (ref 22–32)
Calcium: 9.2 mg/dL (ref 8.9–10.3)
Chloride: 104 mmol/L (ref 98–111)
Creatinine, Ser: 0.76 mg/dL (ref 0.44–1.00)
GFR, Estimated: >60 mL/min (ref >60)
Glucose, Bld: 116 mg/dL — ABNORMAL HIGH (ref 70–99)
Potassium: 3.5 mmol/L (ref 3.5–5.1)
Sodium: 137 mmol/L (ref 135–145)
Total Bilirubin: 0.8 mg/dL (ref 0.3–1.2)
Total Protein: 6.8 g/dL (ref 6.5–8.1)

## 2021-05-29 LAB — CBC WITH DIFFERENTIAL/PLATELET
Abs Immature Granulocytes: 0.01 10*3/uL (ref 0.00–0.07)
Basophils Absolute: 0 10*3/uL (ref 0.0–0.1)
Basophils Relative: 0 %
Eosinophils Absolute: 0.1 10*3/uL (ref 0.0–0.5)
Eosinophils Relative: 2 %
HCT: 45 % (ref 36.0–46.0)
Hemoglobin: 14.5 g/dL (ref 12.0–15.0)
Immature Granulocytes: 0 %
Lymphocytes Relative: 47 %
Lymphs Abs: 2.1 10*3/uL (ref 0.7–4.0)
MCH: 29.2 pg (ref 26.0–34.0)
MCHC: 32.2 g/dL (ref 30.0–36.0)
MCV: 90.5 fL (ref 80.0–100.0)
Monocytes Absolute: 0.4 10*3/uL (ref 0.1–1.0)
Monocytes Relative: 8 %
Neutro Abs: 1.9 10*3/uL (ref 1.7–7.7)
Neutrophils Relative %: 43 %
Platelets: 339 10*3/uL (ref 150–400)
RBC: 4.97 MIL/uL (ref 3.87–5.11)
RDW: 13.7 % (ref 11.5–15.5)
WBC: 4.5 10*3/uL (ref 4.0–10.5)
nRBC: 0 % (ref 0.0–0.2)

## 2021-05-29 LAB — URINALYSIS, MICROSCOPIC (REFLEX)

## 2021-05-29 MED ORDER — HYDRALAZINE HCL 10 MG PO TABS: 10.0000 mg | ORAL_TABLET | Freq: Three times a day (TID) | ORAL | 0 refills | Status: DC | PRN

## 2021-05-29 NOTE — Discharge Instructions (Addendum)
1.  Continue your regular blood pressure medications as you have been taking valsartan and amlodipine.  If you have a blood pressure spike greater than 150/80 after having taken your medications, you may take a 10 mg hydralazine tablet up to every 8 hours.  Monitor your blood pressure response to this medication and review it with your family doctor. 2.  Follow-up with your doctor in 3 to 7 days for blood pressure rechecks. 3.  Return to the emergency department if you develop any concerning or worsening symptoms as outlined in your discharge instructions.

## 2021-05-29 NOTE — ED Triage Notes (Signed)
Patient reports antihypertension for the last several weeks after stopping taking her amlodipine six weeks ago. Patient states her blood pressure was well controlled on the medication so she stopped taking it and tried to continue maintenance of hypertension with herbal supplements. Patient alert, oriented and in no apparent distress at this time. States she resumed taking amlodipine three days ago and blood pressure is still high.

## 2021-05-29 NOTE — ED Provider Notes (Signed)
Emergency Medicine Provider Triage Evaluation Note  Phyllis Johnson , a 70 y.o. female  was evaluated in triage.  Pt complains of fatigue, intermittent hypertension, intermittent lightheadedness/left arm tingling, cough.  Patient is been off her blood pressure medication for several months.  6 weeks ago she started notes her blood pressure was going up.  When her blood pressure is high, she has lightheadedness and left arm tingling.  She also reports over the past week she has had cough and persistent fatigue.  Review of Systems  Positive: Cough, fatigue Negative: fever  Physical Exam  BP (!) 170/102 (BP Location: Left Arm)   Pulse 65   Temp 98.8 F (37.1 C) (Oral)   Resp 18   SpO2 99%  Gen:   Awake, no distress   Resp:  Normal effort  MSK:   Moves extremities without difficulty  Other:  Strength and sensation intact x4  Medical Decision Making  Medically screening exam initiated at 9:27 AM.  Appropriate orders placed.  Roshni Mccrite was informed that the remainder of the evaluation will be completed by another provider, this initial triage assessment does not replace that evaluation, and the importance of remaining in the ED until their evaluation is complete.  Labs, ekg, ua, cxr, ct    Franchot Heidelberg, PA-C 05/29/21 [id number]    Luna Fuse, MD 05/29/21 [phone]

## 2021-05-29 NOTE — ED Provider Notes (Signed)
[facility] EMERGENCY DEPARTMENT Provider Note   CSN: [id number] Arrival date & time: 05/29/21  [id number]     History Chief Complaint  Patient presents with   Hypertension    Phyllis Johnson is a 70 y.o. female.  HPI Patient reports her blood pressure was very well controlled for over a year on the combination of amlodipine and valsartan.  She was even getting episodes of low blood pressure.  She had been working out and careful with diet.  At that time she decreased and then ultimately ended up stopping amlodipine.  Patient notes that after the last several weeks however her blood pressure has been climbing and she is getting periodic spikes of diastolics up to [id number] or greater and systolics 50 or greater.  Patient reports these seem to be happening a lot at nighttime.  When her blood pressure is high, she notes she feels more lightheaded and has even at times noted a tingling in the left arm.  She reports sometimes after working out her blood pressure will actually be quite low.  Patient reports she is very physically active and exercises for an hour at a time without getting chest pain shortness of breath or feeling like she would get lightheaded or pass out.  However she feels pretty good and is not symptomatic although at times notes some additional fatigue.  Patient reports that she got more concerned yesterday    Past Medical History:  Diagnosis Date   Allergy    Anxiety    under control with prayer and exercise.   Arrhythmia    "irregular heart beat" during stress test in [place]   Chronic bronchitis ([place])    Depression    in remission with counselling   Hypertension     Patient Active Problem List   Diagnosis Date Noted   Right renal artery stenosis ([place]) 07/03/2019   GAD (generalized anxiety disorder) 06/27/2019   Stress at home 03/07/2018   Atypical chest pain 11/11/2016   Abnormal EKG 11/11/2016   Situational anxiety 09/29/2016   HTN (hypertension),  benign 08/31/2016   Chronic bronchitis with acute exacerbation ([place]) 08/31/2016   Chronic allergic rhinitis 08/31/2016    Past Surgical History:  Procedure Laterality Date   ABDOMINAL HYSTERECTOMY     with oophrectomy, secondary to large uterine fibriods   BREAST EXCISIONAL BIOPSY Right      OB History   No obstetric history on file.     Family History  Problem Relation Age of Onset   Hypertension Father    Stroke Father        hemorrhagic   Heart disease Father    Heart attack Father 39   COPD Sister    Cancer Sister        lung secondary to tobacco use   Fibroids Sister        uterine   Cancer Cousin        breast   Alcohol abuse Mother     Social History   Tobacco Use   Smoking status: Former    Packs/day: 1.50    Years: 30.00    Pack years: 45.00    Types: Cigarettes    Quit date: 2008    Years since quitting: 14.6   Smokeless tobacco: Never  Substance Use Topics   Alcohol use: No   Drug use: No    Comment: Clean since 1993; previous crack-cocaine    Home Medications Prior to Admission medications   Medication Sig  Start Date End Date Taking? Authorizing Provider  hydrALAZINE (APRESOLINE) 10 MG tablet Take 1 tablet (10 mg total) by mouth 3 (three) times daily as needed. 05/29/21  Yes Tayen Narang, Jeannie Done, MD  amLODipine (NORVASC) 5 MG tablet TAKE 1 TABLET BY MOUTH EVERYDAY AT BEDTIME 02/02/21   Nche, Charlene Brooke, NP  hydrOXYzine (ATARAX/VISTARIL) 25 MG tablet Take 1 tablet (25 mg total) by mouth at bedtime as needed for anxiety. 11/19/19   Nche, Charlene Brooke, NP  Multiple Vitamin (MULTIVITAMIN) tablet Take 1 tablet by mouth daily.    [provider]  Omega-3 Fatty Acids (FISH OIL PO) Take by mouth.    [provider]  valsartan (DIOVAN) 40 MG tablet TAKE 1 TO 2 TABLETS BY MOUTH DAILY, 1 TAB IF BP<140/80, AND 2 TABS IF BP>150/90 01/29/21   Nche, Charlene Brooke, NP    Allergies    Patient has no known allergies.  Review of Systems   Review  of Systems 10 systems reviewed and negative except as per HPI Physical Exam Updated Vital Signs BP 137/86   Pulse (!) 52   Temp 98.8 F (37.1 C) (Oral)   Resp 18   SpO2 97%   Physical Exam Constitutional:      Appearance: Normal appearance.  HENT:     Mouth/Throat:     Pharynx: Oropharynx is clear.  Eyes:     Extraocular Movements: Extraocular movements intact.  Cardiovascular:     Rate and Rhythm: Normal rate and regular rhythm.     Heart sounds: No murmur heard. Pulmonary:     Effort: Pulmonary effort is normal.     Breath sounds: Normal breath sounds.  Abdominal:     General: There is no distension.     Palpations: Abdomen is soft.     Tenderness: There is no abdominal tenderness. There is no guarding.  Musculoskeletal:        General: No swelling. Normal range of motion.     Right lower leg: No edema.     Left lower leg: No edema.  Skin:    General: Skin is warm and dry.  Neurological:     General: No focal deficit present.     Mental Status: She is alert and oriented to person, place, and time.     Cranial Nerves: No cranial nerve deficit.     Motor: No weakness.     Coordination: Coordination normal.  Psychiatric:        Mood and Affect: Mood normal.    ED Results / Procedures / Treatments   Labs (all labs ordered are listed, but only abnormal results are displayed) Labs Reviewed  COMPREHENSIVE METABOLIC PANEL - Abnormal; Notable for the following components:      Result Value   Glucose, Bld 116 (*)    BUN 7 (*)    All other components within normal limits  URINALYSIS, ROUTINE W REFLEX MICROSCOPIC - Abnormal; Notable for the following components:   APPearance HAZY (*)    Leukocytes,Ua MODERATE (*)    All other components within normal limits  URINALYSIS, MICROSCOPIC (REFLEX) - Abnormal; Notable for the following components:   Bacteria, UA RARE (*)    Non Squamous Epithelial PRESENT (*)    All other components within normal limits  CBC WITH  DIFFERENTIAL/PLATELET    EKG EKG Interpretation  Date/Time:  Friday May 29 2021 09:09:41 EDT Ventricular Rate:  72 PR Interval:  136 QRS Duration: 74 QT Interval:  374 QTC Calculation: 409 R Axis:   -  51 Text Interpretation: Normal sinus rhythm with sinus arrhythmia Left anterior fascicular block Minimal voltage criteria for LVH, may be normal variant ( R in aVL ) Nonspecific ST and T wave abnormality Abnormal ECG no sig change from previous Confirmed by Charlesetta Shanks [phone]) on 05/29/2021 12:49:17 PM  Radiology DG Chest 2 View  Result Date: 05/29/2021 CLINICAL DATA:  Hypertension, cough EXAM: CHEST - 2 VIEW COMPARISON:  03/03/2018 FINDINGS: The normal mediastinum with ectatic aorta. No effusion, infiltrate or pneumothorax. Sigmoid scoliosis spine. IMPRESSION: No active cardiopulmonary disease. Electronically Signed   By: Suzy Bouchard M.D.   On: 05/29/2021 10:26   CT HEAD WO CONTRAST (5MM)  Result Date: 05/29/2021 CLINICAL DATA:  Dizziness, nonspecific. Additional history provided: Dizziness and brain fog with high blood pressure. EXAM: CT HEAD WITHOUT CONTRAST TECHNIQUE: Contiguous axial images were obtained from the base of the skull through the vertex without intravenous contrast. COMPARISON:  None. FINDINGS: Brain: Cerebral volume is normal for age. There is no acute intracranial hemorrhage. No demarcated cortical infarct. No extra-axial fluid collection. No evidence of an intracranial mass. No midline shift. Partially empty sella turcica. Vascular: No hyperdense vessel.  Atherosclerotic calcifications. Skull: Normal. Negative for fracture or focal lesion. Sinuses/Orbits: Visualized orbits show no acute finding. Complete opacification of the left sphenoid sinus with associated chronic reactive osteitis. IMPRESSION: No evidence of acute intracranial abnormality. Severe chronic left sphenoid sinusitis. Electronically Signed   By: Kellie Simmering D.O.   On: 05/29/2021 10:26     Procedures Procedures   Medications Ordered in ED Medications - No data to display  ED Course  I have reviewed the triage vital signs and the nursing notes.  Pertinent labs & imaging results that were available during my care of the patient were reviewed by me and considered in my medical decision making (see chart for details).    MDM Rules/Calculators/A&P                           She is experiencing blood pressure spikes.  She is compliant with medications and leads healthy lifestyle.  Patient does endorse some stressors that she manages.  Symptoms are improved actually when she exercises.  Patient had just recently resumed taking amlodipine after having graduated off of it.  This time blood pressures have normalized in the emergency department the patient is asymptomatic.  After extensive discussion, plan will be to continue the amlodipine in the evening and continue monitoring blood pressures for about another 3 days with some as needed hydralazine coverage.  Patient is reluctant to increase her amlodipine dose to 10 mg.  She will continue with her home management of hypertension and close follow-up with PCP. Final Clinical Impression(s) / ED Diagnoses Final diagnoses:  Labile hypertension    Rx / DC Orders ED Discharge Orders          Ordered    hydrALAZINE (APRESOLINE) 10 MG tablet  3 times daily PRN        05/29/21 1237             Charlesetta Shanks, MD 05/29/21 1252

## 2021-06-04 ENCOUNTER — Ambulatory Visit (INDEPENDENT_AMBULATORY_CARE_PROVIDER_SITE_OTHER): Payer: Medicare Other | Admitting: Nurse Practitioner

## 2021-06-04 ENCOUNTER — Other Ambulatory Visit: Payer: Self-pay

## 2021-06-04 ENCOUNTER — Encounter: Payer: Self-pay | Admitting: Nurse Practitioner

## 2021-06-04 VITALS — BP 108/80 | HR 60 | Temp 97.0°F | Ht 59.0 in | Wt 135.0 lb

## 2021-06-04 DIAGNOSIS — I701 Atherosclerosis of renal artery: Secondary | ICD-10-CM

## 2021-06-04 DIAGNOSIS — I10 Essential (primary) hypertension: Secondary | ICD-10-CM

## 2021-06-04 MED ORDER — AMLODIPINE BESYLATE 5 MG PO TABS: 5.0000 mg | ORAL_TABLET | Freq: Every day | ORAL | 3 refills | Status: DC

## 2021-06-04 MED ORDER — VALSARTAN 40 MG PO TABS: ORAL_TABLET | ORAL | 3 refills | Status: DC

## 2021-06-04 NOTE — Progress Notes (Signed)
Subjective:  Patient ID: Phyllis Johnson, female    DOB: Nov 27, 1950  Age: 70 y.o. MRN: [id number]  CC: Hospitalization Follow-up [facility] f/u, pt states she has been doing much better since leaving the hospital. Pt states she had stopped taking her amlodipine and this is what put her in the hospital. Pt states she has started back taking medication. )   HPI  Right renal artery stenosis (HCC) Stable renal function Recent elevated BP due to discontinuation of amlodipine. Improved BP control with resuming amlodipine '5mg'$  and valsartan '40mg'$  CMP     Component Value Date/Time   NA 137 05/29/2021 0928   K 3.5 05/29/2021 0928   CL 104 05/29/2021 0928   CO2 24 05/29/2021 0928   GLUCOSE 116 (H) 05/29/2021 0928   BUN 7 (L) 05/29/2021 0928   CREATININE 0.76 05/29/2021 0928   CREATININE 0.77 04/17/2019 1033   CALCIUM 9.2 05/29/2021 0928   PROT 6.8 05/29/2021 0928   ALBUMIN 4.2 05/29/2021 0928   AST 16 05/29/2021 0928   ALT 11 05/29/2021 0928   ALKPHOS 94 05/29/2021 0928   BILITOT 0.8 05/29/2021 0928   GFRNONAA >60 05/29/2021 0928   GFRAA >60 08/25/2018 2130   Repeat renal duplex  HTN (hypertension), benign BP at goal resuming amlodipine and valsartan Advised about importance of medication compliance. BP Readings from Last 3 Encounters:  06/04/21 108/80  05/29/21 (!) 139/92  05/26/21 (!) 171/97   Refill sent F/up in19month  Reviewed past Medical, Social and Family history today.  Outpatient Medications Prior to Visit  Medication Sig Dispense Refill   hydrALAZINE (APRESOLINE) 10 MG tablet Take 1 tablet (10 mg total) by mouth 3 (three) times daily as needed. 60 tablet 0   hydrOXYzine (ATARAX/VISTARIL) 25 MG tablet Take 1 tablet (25 mg total) by mouth at bedtime as needed for anxiety. 14 tablet 0   Multiple Vitamin (MULTIVITAMIN) tablet Take 1 tablet by mouth daily.     Omega-3 Fatty Acids (FISH OIL PO) Take by mouth.     amLODipine (NORVASC) 5 MG tablet TAKE 1 TABLET BY  MOUTH EVERYDAY AT BEDTIME 90 tablet 0   valsartan (DIOVAN) 40 MG tablet TAKE 1 TO 2 TABLETS BY MOUTH DAILY, 1 TAB IF BP<140/80, AND 2 TABS IF BP>150/90 120 tablet 0   No facility-administered medications prior to visit.    ROS See HPI  Objective:  BP 108/80 (BP Location: Left Arm, Patient Position: Sitting, Cuff Size: Normal)   Pulse 60   Temp (!) 97 F (36.1 C) (Temporal)   Ht '4\' 11"'$  (1.499 m)   Wt 135 lb (61.2 kg)   SpO2 98%   BMI 27.27 kg/m   Physical Exam Vitals reviewed.  Cardiovascular:     Rate and Rhythm: Normal rate and regular rhythm.     Pulses: Normal pulses.     Heart sounds: Normal heart sounds.  Pulmonary:     Effort: Pulmonary effort is normal.     Breath sounds: Normal breath sounds.  Musculoskeletal:     Right lower leg: No edema.     Left lower leg: No edema.  Neurological:     Mental Status: She is alert and oriented to person, place, and time.   Assessment & Plan:  This visit occurred during the SARS-CoV-2 public health emergency.  Safety protocols were in place, including screening questions prior to the visit, additional usage of staff PPE, and extensive cleaning of exam room while observing appropriate contact time as indicated for disinfecting  solutions.   Phyllis Johnson was seen today for hospitalization follow-up.  Diagnoses and all orders for this visit:  HTN (hypertension), benign -     amLODipine (NORVASC) 5 MG tablet; Take 1 tablet (5 mg total) by mouth daily. -     valsartan (DIOVAN) 40 MG tablet; TAKE 1 TO 2 TABLETS BY MOUTH DAILY, 1 TAB IF BP<140/80, AND 2 TABS IF BP>150/90 -     US Renal Artery Stenosis; Future  Right renal artery stenosis (HCC) -     US Renal Artery Stenosis; Future   Problem List Items Addressed This Visit       Cardiovascular and Mediastinum   HTN (hypertension), benign - Primary    BP at goal resuming amlodipine and valsartan Advised about importance of medication compliance. BP Readings from Last 3 Encounters:   06/04/21 108/80  05/29/21 (!) 139/92  05/26/21 (!) 171/97   Refill sent F/up in9month     Relevant Medications   amLODipine (NORVASC) 5 MG tablet   valsartan (DIOVAN) 40 MG tablet   Other Relevant Orders   [place]   Right renal artery stenosis (HCC)    Stable renal function Recent elevated BP due to discontinuation of amlodipine. Improved BP control with resuming amlodipine '5mg'$  and valsartan '40mg'$  CMP     Component Value Date/Time   NA 137 05/29/2021 0928   K 3.5 05/29/2021 0928   CL 104 05/29/2021 0928   CO2 24 05/29/2021 0928   GLUCOSE 116 (H) 05/29/2021 0928   BUN 7 (L) 05/29/2021 0928   CREATININE 0.76 05/29/2021 0928   CREATININE 0.77 04/17/2019 1033   CALCIUM 9.2 05/29/2021 0928   PROT 6.8 05/29/2021 0928   ALBUMIN 4.2 05/29/2021 0928   AST 16 05/29/2021 0928   ALT 11 05/29/2021 0928   ALKPHOS 94 05/29/2021 0928   BILITOT 0.8 05/29/2021 0928   GFRNONAA >60 05/29/2021 0928   GFRAA >60 08/25/2018 2130   Repeat renal duplex      Relevant Medications   amLODipine (NORVASC) 5 MG tablet   valsartan (DIOVAN) 40 MG tablet   Other Relevant Orders   [place]    Follow-up: Return in about 4 weeks (around 07/02/2021) for CPE (fatsing).  CWilfred Lacy NP

## 2021-06-04 NOTE — Assessment & Plan Note (Signed)
Stable renal function Recent elevated BP due to discontinuation of amlodipine. Improved BP control with resuming amlodipine '5mg'$  and valsartan '40mg'$  CMP     Component Value Date/Time   NA 137 05/29/2021 0928   K 3.5 05/29/2021 0928   CL 104 05/29/2021 0928   CO2 24 05/29/2021 0928   GLUCOSE 116 (H) 05/29/2021 0928   BUN 7 (L) 05/29/2021 0928   CREATININE 0.76 05/29/2021 0928   CREATININE 0.77 04/17/2019 1033   CALCIUM 9.2 05/29/2021 0928   PROT 6.8 05/29/2021 0928   ALBUMIN 4.2 05/29/2021 0928   AST 16 05/29/2021 0928   ALT 11 05/29/2021 0928   ALKPHOS 94 05/29/2021 0928   BILITOT 0.8 05/29/2021 0928   GFRNONAA >60 05/29/2021 0928   GFRAA >60 08/25/2018 2130   Repeat renal duplex

## 2021-06-04 NOTE — Assessment & Plan Note (Signed)
BP at goal resuming amlodipine and valsartan Advised about importance of medication compliance. BP Readings from Last 3 Encounters:  06/04/21 108/80  05/29/21 (!) 139/92  05/26/21 (!) 171/97   Refill sent F/up in32month

## 2021-06-04 NOTE — Patient Instructions (Signed)
Maintain amlodipine and valsartan dose.  You will be contacted to schedule appt for repeat renal US. This is to re eval R. Renal artery stenosis.  Hold valsartan if BP is less than or equal to 110/60.

## 2021-06-24 ENCOUNTER — Encounter: Payer: Self-pay | Admitting: Nurse Practitioner

## 2021-06-24 ENCOUNTER — Other Ambulatory Visit: Payer: Self-pay | Admitting: Nurse Practitioner

## 2021-06-24 DIAGNOSIS — Z1231 Encounter for screening mammogram for malignant neoplasm of breast: Secondary | ICD-10-CM

## 2021-07-13 ENCOUNTER — Ambulatory Visit (INDEPENDENT_AMBULATORY_CARE_PROVIDER_SITE_OTHER): Payer: Medicare Other | Admitting: Nurse Practitioner

## 2021-07-13 ENCOUNTER — Encounter: Payer: Self-pay | Admitting: Nurse Practitioner

## 2021-07-13 ENCOUNTER — Telehealth: Payer: Self-pay | Admitting: Nurse Practitioner

## 2021-07-13 ENCOUNTER — Other Ambulatory Visit: Payer: Self-pay

## 2021-07-13 VITALS — BP 114/60 | HR 64 | Temp 96.7°F | Ht <= 58 in | Wt 135.2 lb

## 2021-07-13 DIAGNOSIS — R739 Hyperglycemia, unspecified: Secondary | ICD-10-CM

## 2021-07-13 DIAGNOSIS — Z1211 Encounter for screening for malignant neoplasm of colon: Secondary | ICD-10-CM

## 2021-07-13 DIAGNOSIS — Z0000 Encounter for general adult medical examination without abnormal findings: Secondary | ICD-10-CM

## 2021-07-13 DIAGNOSIS — I701 Atherosclerosis of renal artery: Secondary | ICD-10-CM

## 2021-07-13 NOTE — Telephone Encounter (Signed)
Inform Ms. Gaul to cal and schedule appt for renal US. [phone]. There have been several attempts to reach her already.

## 2021-07-13 NOTE — Telephone Encounter (Signed)
Patient notified and verbalized understanding stating she will call and schedule appointment today.

## 2021-07-13 NOTE — Progress Notes (Signed)
Subjective:    Patient ID: Phyllis Johnson, female    DOB: [birth year], 70 y.o.   MRN: [id number]  Patient presents today for CPE  HPI  Vision:will schedule Dental:up to date Diet:heart healthy Exercise:walking and resistance training Weight:  Wt Readings from Last 3 Encounters:  07/13/21 135 lb 3.2 oz (61.3 kg)  06/04/21 135 lb (61.2 kg)  09/01/20 138 lb 9.6 oz (62.9 kg)    Sexual History (orientation,birth control, marital status, STD):s/p hysterectomy, no hx of abnormal pap, no Hx of cervical or uterine cancer, scheduled for repeat mammogram. Up to date with bone density: osteopenia, current use of calcium and vitamin D daily.  Depression/Suicide: Depression screen [facility] 2/9 07/13/2021 07/03/2020 07/25/2019 01/24/2019 12/08/2018 10/21/2017 10/21/2017  Decreased Interest 0 0 0 0 0 0 0  Down, Depressed, Hopeless 0 0 0 0 0 0 0  PHQ - 2 Score 0 0 0 0 0 0 0  Altered sleeping - - 0 - - - -  Tired, decreased energy - - 0 - - - -  Change in appetite - - 0 - - - -  Feeling bad or failure about yourself  - - 0 - - - -  Trouble concentrating - - 0 - - - -  Moving slowly or fidgety/restless - - 0 - - - -  Suicidal thoughts - - 0 - - - -  PHQ-9 Score - - 0 - - - -   Immunizations: (TDAP, Hep C screen, Pneumovax, Influenza, zoster)  Health Maintenance  Topic Date Due   COVID-19 Vaccine (3 - Booster for Moderna series) 09/04/2020   Colon Cancer Screening  10/13/2020   Zoster (Shingles) Vaccine (1 of 2) 10/13/2021*   Flu Shot  01/08/2022*   Tetanus Vaccine  07/13/2022*   Mammogram  07/23/2022   DEXA scan (bone density measurement)  Completed   Hepatitis C Screening: USPSTF Recommendation to screen - Ages 93-79 yo.  Completed   HPV Vaccine  Aged Out  *Topic was postponed. The date shown is not the original due date.   Fall Risk: Fall Risk  09/01/2020 07/03/2020 01/24/2019 12/08/2018 10/21/2017 10/21/2017  Falls in the past year? 0 0 0 0 No Yes  Number falls in past yr: 0 0 - - - 1   Injury with Fall? 0 0 - - - No  Follow up - Falls prevention discussed - - - -   Advanced Directive: Advanced Directives 07/13/2021  Does Patient Have a Medical Advance Directive? No  Would patient like information on creating a medical advance directive? Yes (MAU/Ambulatory/Procedural Areas - Information given)    Medications and allergies reviewed with patient and updated if appropriate.  Patient Active Problem List   Diagnosis Date Noted   Right renal artery stenosis ([place]) 07/03/2019   GAD (generalized anxiety disorder) 06/27/2019   Stress at home 03/07/2018   Atypical chest pain 11/11/2016   Abnormal EKG 11/11/2016   Situational anxiety 09/29/2016   HTN (hypertension), benign 08/31/2016   Chronic bronchitis with acute exacerbation ([place]) 08/31/2016   Chronic allergic rhinitis 08/31/2016   Current Outpatient Medications on File Prior to Visit  Medication Sig Dispense Refill   amLODipine (NORVASC) 5 MG tablet Take 1 tablet (5 mg total) by mouth daily. 90 tablet 3   hydrOXYzine (ATARAX/VISTARIL) 25 MG tablet Take 1 tablet (25 mg total) by mouth at bedtime as needed for anxiety. 14 tablet 0   Multiple Vitamin (MULTIVITAMIN) tablet Take 1 tablet by mouth daily.  Omega-3 Fatty Acids (FISH OIL PO) Take by mouth.     valsartan (DIOVAN) 40 MG tablet TAKE 1 TO 2 TABLETS BY MOUTH DAILY, 1 TAB IF BP<140/80, AND 2 TABS IF BP>150/90 180 tablet 3   No current facility-administered medications on file prior to visit.    Past Medical History:  Diagnosis Date   Allergy    Anxiety    under control with prayer and exercise.   Arrhythmia    "irregular heart beat" during stress test in [place]   Chronic bronchitis ([place])    Depression    in remission with counselling   Hypertension     Past Surgical History:  Procedure Laterality Date   ABDOMINAL HYSTERECTOMY     with oophrectomy, secondary to large uterine fibriods   BREAST EXCISIONAL BIOPSY Right     Social History   Socioeconomic  History   Marital status: Married    Spouse name: Not on file   Number of children: 4   Years of education: Not on file   Highest education level: Not on file  Occupational History   Occupation: retired  Tobacco Use   Smoking status: Former    Packs/day: 1.50    Years: 30.00    Pack years: 45.00    Types: Cigarettes    Quit date: 2008    Years since quitting: 14.7   Smokeless tobacco: Never  Substance and Sexual Activity   Alcohol use: No   Drug use: No    Comment: Clean since 1993; previous crack-cocaine   Sexual activity: Not Currently  Other Topics Concern   Not on file  Social History Narrative   Not on file   Social Determinants of Health   Financial Resource Strain: Not on file  Food Insecurity: Not on file  Transportation Needs: Not on file  Physical Activity: Not on file  Stress: Not on file  Social Connections: Not on file    Family History  Problem Relation Age of Onset   Hypertension Father    Stroke Father        hemorrhagic   Heart disease Father    Heart attack Father 73   COPD Sister    Cancer Sister        lung secondary to tobacco use   Fibroids Sister        uterine   Cancer Cousin        breast   Alcohol abuse Mother         Review of Systems  Constitutional:  Negative for fever, malaise/fatigue and weight loss.  HENT:  Negative for congestion and sore throat.   Eyes:        Negative for visual changes  Respiratory:  Negative for cough and shortness of breath.   Cardiovascular:  Negative for chest pain, palpitations and leg swelling.  Gastrointestinal:  Negative for blood in stool, constipation, diarrhea and heartburn.  Genitourinary:  Negative for dysuria, frequency and urgency.  Musculoskeletal:  Negative for falls, joint pain and myalgias.  Skin:  Negative for rash.  Neurological:  Negative for dizziness, sensory change and headaches.  Endo/Heme/Allergies:  Does not bruise/bleed easily.  Psychiatric/Behavioral:  Negative for  depression, substance abuse and suicidal ideas. The patient is not nervous/anxious.    Objective:   Vitals:   07/13/21 1146  BP: 114/60  Pulse: 64  Temp: (!) 96.7 F (35.9 C)  SpO2: 97%    Body mass index is 28.75 kg/m.   Physical Examination:  Physical Exam  Vitals reviewed.  Constitutional:      General: She is not in acute distress.    Appearance: She is well-developed.  HENT:     Right Ear: Tympanic membrane, ear canal and external ear normal.     Left Ear: Tympanic membrane, ear canal and external ear normal.     Mouth/Throat:     Pharynx: No oropharyngeal exudate.  Eyes:     Extraocular Movements: Extraocular movements intact.     Conjunctiva/sclera: Conjunctivae normal.  Cardiovascular:     Rate and Rhythm: Normal rate.     Pulses: Normal pulses.  Pulmonary:     Effort: Pulmonary effort is normal. No respiratory distress.  Chest:     Chest wall: No tenderness.  Abdominal:     General: There is no distension.  Musculoskeletal:        General: Normal range of motion.     Cervical back: Normal range of motion and neck supple.     Right lower leg: No edema.     Left lower leg: No edema.  Skin:    General: Skin is warm.  Neurological:     Mental Status: She is alert and oriented to person, place, and time.     Deep Tendon Reflexes: Reflexes are normal and symmetric.  Psychiatric:        Mood and Affect: Mood normal.        Behavior: Behavior normal.        Thought Content: Thought content normal.   ASSESSMENT and PLAN: This visit occurred during the SARS-CoV-2 public health emergency.  Safety protocols were in place, including screening questions prior to the visit, additional usage of staff PPE, and extensive cleaning of exam room while observing appropriate contact time as indicated for disinfecting solutions.   Phyllis Johnson was seen today for annual exam.  Diagnoses and all orders for this visit:  Preventative health care  Right renal artery stenosis  ([place]) -     Lipid panel; Future  Hyperglycemia -     Hemoglobin A1c; Future  Colon cancer screening -     Ambulatory referral to Gastroenterology     Problem List Items Addressed This Visit       Cardiovascular and Mediastinum   Right renal artery stenosis [facility])   Relevant Orders   Lipid panel   Other Visit Diagnoses     Preventative health care    -  Primary   Hyperglycemia       Relevant Orders   Hemoglobin A1c   Colon cancer screening       Relevant Orders   Ambulatory referral to Gastroenterology       Follow up: Return in about 6 months (around 01/11/2022) for HTN.  Wilfred Lacy, NP

## 2021-07-13 NOTE — Patient Instructions (Addendum)
Go to [address] for fasting blood draw. You will bee to be fasting 8hrs prior to blood draw Ok to drink water.  You are up to date with bone density Schedule appt with GI for colonoscopy.  You will be contacted to schedule appt for renal US.  Preventive Care 25 Years and Older, Female Preventive care refers to lifestyle choices and visits with your health care provider that can promote health and wellness. This includes: A yearly physical exam. This is also called an annual wellness visit. Regular dental and eye exams. Immunizations. Screening for certain conditions. Healthy lifestyle choices, such as: Eating a healthy diet. Getting regular exercise. Not using drugs or products that contain nicotine and tobacco. Limiting alcohol use. What can I expect for my preventive care visit? Physical exam Your health care provider will check your: Height and weight. These may be used to calculate your BMI (body mass index). BMI is a measurement that tells if you are at a healthy weight. Heart rate and blood pressure. Body temperature. Skin for abnormal spots. Counseling Your health care provider may ask you questions about your: Past medical problems. Family's medical history. Alcohol, tobacco, and drug use. Emotional well-being. Home life and relationship well-being. Sexual activity. Diet, exercise, and sleep habits. History of falls. Memory and ability to understand (cognition). Work and work Statistician. Pregnancy and menstrual history. Access to firearms. What immunizations do I need? Vaccines are usually given at various ages, according to a schedule. Your health care provider will recommend vaccines for you based on your age, medical history, and lifestyle or other factors, such as travel or where you work. What tests do I need? Blood tests Lipid and cholesterol levels. These may be checked every 5 years, or more often depending on your overall health. Hepatitis C  test. Hepatitis B test. Screening Lung cancer screening. You may have this screening every year starting at age 55 if you have a 30-pack-year history of smoking and currently smoke or have quit within the past 15 years. Colorectal cancer screening. All adults should have this screening starting at age 14 and continuing until age 22. Your health care provider may recommend screening at age 10 if you are at increased risk. You will have tests every 1-10 years, depending on your results and the type of screening test. Diabetes screening. This is done by checking your blood sugar (glucose) after you have not eaten for a while (fasting). You may have this done every 1-3 years. Mammogram. This may be done every 1-2 years. Talk with your health care provider about how often you should have regular mammograms. Abdominal aortic aneurysm (AAA) screening. You may need this if you are a current or former smoker. BRCA-related cancer screening. This may be done if you have a family history of breast, ovarian, tubal, or peritoneal cancers. Other tests STD (sexually transmitted disease) testing, if you are at risk. Bone density scan. This is done to screen for osteoporosis. You may have this done starting at age 59. Talk with your health care provider about your test results, treatment options, and if necessary, the need for more tests. Follow these instructions at home: Eating and drinking  Eat a diet that includes fresh fruits and vegetables, whole grains, lean protein, and low-fat dairy products. Limit your intake of foods with high amounts of sugar, saturated fats, and salt. Take vitamin and mineral supplements as recommended by your health care provider. Do not drink alcohol if your health care provider tells  you not to drink. If you drink alcohol: Limit how much you have to 0-1 drink a day. Be aware of how much alcohol is in your drink. In the [place] one drink equals one 12 oz bottle of beer (355  mL), one 5 oz glass of wine (148 mL), or one 1 oz glass of hard liquor (44 mL). Lifestyle Take daily care of your teeth and gums. Brush your teeth every morning and night with fluoride toothpaste. Floss one time each day. Stay active. Exercise for at least 30 minutes 5 or more days each week. Do not use any products that contain nicotine or tobacco, such as cigarettes, e-cigarettes, and chewing tobacco. If you need help quitting, ask your health care provider. Do not use drugs. If you are sexually active, practice safe sex. Use a condom or other form of protection in order to prevent STIs (sexually transmitted infections). Talk with your health care provider about taking a low-dose aspirin or statin. Find healthy ways to cope with stress, such as: Meditation, yoga, or listening to music. Journaling. Talking to a trusted person. Spending time with friends and family. Safety Always wear your seat belt while driving or riding in a vehicle. Do not drive: If you have been drinking alcohol. Do not ride with someone who has been drinking. When you are tired or distracted. While texting. Wear a helmet and other protective equipment during sports activities. If you have firearms in your house, make sure you follow all gun safety procedures. What's next? Visit your health care provider once a year for an annual wellness visit. Ask your health care provider how often you should have your eyes and teeth checked. Stay up to date on all vaccines. This information is not intended to replace advice given to you by your health care provider. Make sure you discuss any questions you have with your health care provider. Document Revised: 12/05/2020 Document Reviewed: 09/21/2018 Elsevier Patient Education  2022 [employer].

## 2021-07-14 ENCOUNTER — Other Ambulatory Visit (INDEPENDENT_AMBULATORY_CARE_PROVIDER_SITE_OTHER): Payer: Medicare Other

## 2021-07-14 DIAGNOSIS — R739 Hyperglycemia, unspecified: Secondary | ICD-10-CM

## 2021-07-14 DIAGNOSIS — I701 Atherosclerosis of renal artery: Secondary | ICD-10-CM

## 2021-07-14 LAB — HEMOGLOBIN A1C: Hgb A1c MFr Bld: 6.2 % (ref 4.6–6.5)

## 2021-07-14 LAB — LIPID PANEL
Cholesterol: 204 mg/dL — ABNORMAL HIGH (ref 0–200)
HDL: 87.8 mg/dL (ref >39.00)
LDL Cholesterol: 104 mg/dL — ABNORMAL HIGH (ref 0–99)
NonHDL: 116.69
Total CHOL/HDL Ratio: 2
Triglycerides: 65 mg/dL (ref 0.0–149.0)
VLDL: 13 mg/dL (ref 0.0–40.0)

## 2021-07-21 ENCOUNTER — Ambulatory Visit
Admission: RE | Admit: 2021-07-21 | Discharge: 2021-07-21 | Disposition: A | Discharge status: Home / Self Care | Payer: Medicare Other | Source: Ambulatory Visit | Attending: Nurse Practitioner | Admitting: Nurse Practitioner

## 2021-07-21 DIAGNOSIS — I10 Essential (primary) hypertension: Secondary | ICD-10-CM

## 2021-07-21 DIAGNOSIS — I701 Atherosclerosis of renal artery: Secondary | ICD-10-CM

## 2021-07-29 ENCOUNTER — Ambulatory Visit
Admission: RE | Admit: 2021-07-29 | Discharge: 2021-07-29 | Disposition: A | Discharge status: Home / Self Care | Payer: Medicare Other | Source: Ambulatory Visit | Attending: Nurse Practitioner | Admitting: Nurse Practitioner

## 2021-07-29 ENCOUNTER — Other Ambulatory Visit: Payer: Self-pay

## 2021-07-29 DIAGNOSIS — Z1231 Encounter for screening mammogram for malignant neoplasm of breast: Secondary | ICD-10-CM

## 2021-09-02 ENCOUNTER — Telehealth: Payer: Self-pay | Admitting: Nurse Practitioner

## 2021-09-02 NOTE — Chronic Care Management (AMB) (Signed)
  Chronic Care Management   Note  09/02/2021 Name: Phyllis Johnson MRN: [id number] DOB: 05-30-1951  Phyllis Johnson is a 70 y.o. year old female who is a primary care patient of Nche, Charlene Brooke, NP. I reached out to Jetty Peeks by phone today in response to a referral sent by Phyllis Johnson's PCP, Nche, Charlene Brooke, NP.   Phyllis Johnson was given information about Chronic Care Management services today including:  CCM service includes personalized support from designated clinical staff supervised by her physician, including individualized plan of care and coordination with other care providers 24/7 contact phone numbers for assistance for urgent and routine care needs. Service will only be billed when office clinical staff spend 20 minutes or more in a month to coordinate care. Only one practitioner may furnish and bill the service in a calendar month. The patient may stop CCM services at any time (effective at the end of the month) by phone call to the office staff.   Patient agreed to services and verbal consent obtained.   Follow up plan:   Phyllis Johnson

## 2021-09-16 ENCOUNTER — Ambulatory Visit (INDEPENDENT_AMBULATORY_CARE_PROVIDER_SITE_OTHER): Payer: Medicare Other

## 2021-09-16 DIAGNOSIS — Z0000 Encounter for general adult medical examination without abnormal findings: Secondary | ICD-10-CM

## 2021-09-16 DIAGNOSIS — Z0100 Encounter for examination of eyes and vision without abnormal findings: Secondary | ICD-10-CM

## 2021-09-16 NOTE — Progress Notes (Signed)
Subjective:   Phyllis Johnson is a 70 y.o. female who presents for Medicare Annual (Subsequent) preventive examination.  I connected with Phyllis Johnson today by telephone and verified that I am speaking with the correct person using two identifiers. Location patient: home Location provider: work Persons participating in the virtual visit: patient, provider.   I discussed the limitations, risks, security and privacy concerns of performing an evaluation and management service by telephone and the availability of in person appointments. I also discussed with the patient that there may be a patient responsible charge related to this service. The patient expressed understanding and verbally consented to this telephonic visit.    Interactive audio and video telecommunications were attempted between this provider and patient, however failed, due to patient having technical difficulties OR patient did not have access to video capability.  We continued and completed visit with audio only.    Review of Systems     Cardiac Risk Factors include: advanced age (>37men, >67 women);hypertension     Objective:    Today's Vitals   There is no height or weight on file to calculate BMI.  Advanced Directives 09/16/2021 07/13/2021 07/03/2020 01/24/2019 08/27/2018 08/25/2018 11/01/2016  Does Patient Have a Medical Advance Directive? No No No No No No No  Would patient like information on creating a medical advance directive? No - Patient declined Yes (MAU/Ambulatory/Procedural Areas - Information given) Yes (MAU/Ambulatory/Procedural Areas - Information given) No - Patient declined No - Patient declined No - Patient declined -    Current Medications (verified) Outpatient Encounter Medications as of 09/16/2021  Medication Sig   amLODipine (NORVASC) 5 MG tablet Take 1 tablet (5 mg total) by mouth daily.   hydrOXYzine (ATARAX/VISTARIL) 25 MG tablet Take 1 tablet (25 mg total) by mouth at bedtime as  needed for anxiety.   Multiple Vitamin (MULTIVITAMIN) tablet Take 1 tablet by mouth daily.   Omega-3 Fatty Acids (FISH OIL PO) Take by mouth.   valsartan (DIOVAN) 40 MG tablet TAKE 1 TO 2 TABLETS BY MOUTH DAILY, 1 TAB IF BP<140/80, AND 2 TABS IF BP>150/90   No facility-administered encounter medications on file as of 09/16/2021.    Allergies (verified) Patient has no known allergies.   History: Past Medical History:  Diagnosis Date   Allergy    Anxiety    under control with prayer and exercise.   Arrhythmia    "irregular heart beat" during stress test in [place]   Chronic bronchitis ([place])    Depression    in remission with counselling   Hypertension    Past Surgical History:  Procedure Laterality Date   ABDOMINAL HYSTERECTOMY     with oophrectomy, secondary to large uterine fibriods   BREAST EXCISIONAL BIOPSY Right    Family History  Problem Relation Age of Onset   Hypertension Father    Stroke Father        hemorrhagic   Heart disease Father    Heart attack Father 43   COPD Sister    Cancer Sister        lung secondary to tobacco use   Fibroids Sister        uterine   Cancer Cousin        breast   Alcohol abuse Mother    Social History   Socioeconomic History   Marital status: Married    Spouse name: Not on file   Number of children: 4   Years of education: Not on file   Highest education  level: Not on file  Occupational History   Occupation: retired  Tobacco Use   Smoking status: Former    Packs/day: 1.50    Years: 30.00    Pack years: 45.00    Types: Cigarettes    Quit date: 2008    Years since quitting: 14.9   Smokeless tobacco: Never  Substance and Sexual Activity   Alcohol use: No   Drug use: No    Comment: Clean since 1993; previous crack-cocaine   Sexual activity: Not Currently  Other Topics Concern   Not on file  Social History Narrative   Not on file   Social Determinants of Health   Financial Resource Strain: Low Risk    Difficulty of  Paying Living Expenses: Not hard at all  Food Insecurity: No Food Insecurity   Worried About Charity fundraiser in the Last Year: Never true   [place] in the Last Year: Never true  Transportation Needs: No Transportation Needs   Lack of Transportation (Medical): No   Lack of Transportation (Non-Medical): No  Physical Activity: Sufficiently Active   Days of Exercise per Week: 4 days   Minutes of Exercise per Session: 60 min  Stress: No Stress Concern Present   Feeling of Stress : Not at all  Social Connections: Moderately Isolated   Frequency of Communication with Friends and Family: Twice a week   Frequency of Social Gatherings with Friends and Family: Twice a week   Attends Religious Services: More than 4 times per year   Active Member of [employer] or Organizations: No   Attends Music therapist: Never   Marital Status: Separated    Tobacco Counseling Counseling given: Not Answered   Clinical Intake:  Pre-visit preparation completed: Yes  Pain : No/denies pain     Nutritional Risks: None Diabetes: No  How often do you need to have someone help you when you read instructions, pamphlets, or other written materials from your doctor or pharmacy?: 1 - Never What is the last grade level you completed in school?: [place]   Interpreter Needed?: No  Information entered by :: [place] of Daily Living In your present state of health, do you have any difficulty performing the following activities: 09/16/2021  Hearing? N  Vision? N  Difficulty concentrating or making decisions? N  Walking or climbing stairs? N  Dressing or bathing? N  Doing errands, shopping? N  Preparing Food and eating ? N  Using the Toilet? N  In the past six months, have you accidently leaked urine? N  Do you have problems with loss of bowel control? N  Managing your Medications? N  Managing your Finances? N  Housekeeping or managing your  Housekeeping? N  Some recent data might be hidden    Patient Care Team: N[place], Charlene Brooke, NP as PCP - General (Internal Medicine) Germaine Pomfret, [facility] as Pharmacist (Pharmacist)  Indicate any recent Medical Services you may have received from other than [facility] providers in the past year (date may be approximate).     Assessment:   This is a routine wellness examination for Diana.  Hearing/Vision screen Vision Screening - Comments:: Referral 09/16/2021  Dietary issues and exercise activities discussed: Current Exercise Habits: Home exercise routine, Type of exercise: walking, Time (Minutes): 60, Frequency (Times/Week): 4, Weekly Exercise (Minutes/Week): 240, Intensity: Mild, Exercise limited by: None identified   Goals Addressed  This Visit's Progress    Exercise 150 min/wk Moderate Activity   On track    Stay consistent with exercise and healthy diet.       Depression Screen PHQ 2/9 Scores 09/16/2021 07/13/2021 07/03/2020 07/25/2019 01/24/2019 12/08/2018 10/21/2017  PHQ - 2 Score 0 0 0 0 0 0 0  PHQ- 9 Score - - - 0 - - -    Fall Risk Fall Risk  09/16/2021 09/01/2020 07/03/2020 01/24/2019 12/08/2018  Falls in the past year? 0 0 0 0 0  Number falls in past yr: 0 0 0 - -  Injury with Fall? 0 0 0 - -  Follow up Falls evaluation completed - Falls prevention discussed - -    FALL RISK PREVENTION PERTAINING TO THE HOME:  Any stairs in or around the home? Yes  If so, are there any without handrails? No  Home free of loose throw rugs in walkways, pet beds, electrical cords, etc? Yes  Adequate lighting in your home to reduce risk of falls? Yes   ASSISTIVE DEVICES UTILIZED TO PREVENT FALLS:  Life alert? No  Use of a cane, walker or w/c? No  Grab bars in the bathroom? Yes  Shower chair or bench in shower? Yes  Elevated toilet seat or a handicapped toilet? Yes   Cognitive Function:Normal cognitive status assessed by direct observation by this Nurse Health  Advisor. No abnormalities found.   MMSE - Mini Mental State Exam 10/21/2017  Orientation to time 5  Orientation to Place 5  Registration 3  Attention/ Calculation 5  Recall 3  Language- name 2 objects 2  Language- repeat 1  Language- follow 3 step command 3  Language- read & follow direction 1  Write a sentence 1  Copy design 1  Total score 30        Immunizations Immunization History  Administered Date(s) Administered   Moderna Sars-Covid-2 Vaccination 03/04/2020, 04/04/2020    TDAP status: Due, Education has been provided regarding the importance of this vaccine. Advised may receive this vaccine at local pharmacy or Health Dept. Aware to provide a copy of the vaccination record if obtained from local pharmacy or Health Dept. Verbalized acceptance and understanding.  Flu Vaccine status: Declined, Education has been provided regarding the importance of this vaccine but patient still declined. Advised may receive this vaccine at local pharmacy or Health Dept. Aware to provide a copy of the vaccination record if obtained from local pharmacy or Health Dept. Verbalized acceptance and understanding.  Pneumococcal vaccine status: Declined,  Education has been provided regarding the importance of this vaccine but patient still declined. Advised may receive this vaccine at local pharmacy or Health Dept. Aware to provide a copy of the vaccination record if obtained from local pharmacy or Health Dept. Verbalized acceptance and understanding.   Covid-19 vaccine status: Completed vaccines  Qualifies for Shingles Vaccine? Yes   Zostavax completed No   Shingrix Completed?: No.    Education has been provided regarding the importance of this vaccine. Patient has been advised to call insurance company to determine out of pocket expense if they have not yet received this vaccine. Advised may also receive vaccine at local pharmacy or Health Dept. Verbalized acceptance and understanding.  Screening  Tests Health Maintenance  Topic Date Due   Pneumonia Vaccine 60+ Years old (1 - PCV) Never done   COVID-19 Vaccine (3 - Booster for Moderna series) 05/30/2020   COLONOSCOPY (Pts 45-68yrs Insurance coverage will need to be confirmed)  10/13/2020  Zoster Vaccines- Shingrix (1 of 2) 10/13/2021 (Originally 01/12/2003)   INFLUENZA VACCINE  01/08/2022 (Originally 05/11/2021)   TETANUS/TDAP  07/13/2022 (Originally [birth year]/1972)   MAMMOGRAM  07/29/2022   DEXA SCAN  Completed   Hepatitis C Screening  Completed   HPV VACCINES  Aged Out    Health Maintenance  Health Maintenance Due  Topic Date Due   Pneumonia Vaccine 31+ Years old (1 - PCV) Never done   COVID-19 Vaccine (3 - Booster for Moderna series) 05/30/2020   COLONOSCOPY (Pts 45-84yrs Insurance coverage will need to be confirmed)  10/13/2020    Colorectal cancer screening: Referral to GI placed 09/16/2021. Pt aware the office will call re: appt.  Mammogram status: Completed 07/29/2021. Repeat every year  Bone Density status: Completed 07/17/2019. Results reflect: Bone density results: OSTEOPENIA. Repeat every 5 years.  Lung Cancer Screening: (Low Dose CT Chest recommended if Age 20-80 years, 30 pack-year currently smoking OR have quit w/in 15years.) does not qualify.   Lung Cancer Screening Referral: n/a  Additional Screening:  Hepatitis C Screening: does not qualify; Completed 12/202017  Vision Screening: Recommended annual ophthalmology exams for early detection of glaucoma and other disorders of the eye. Is the patient up to date with their annual eye exam?  No  Who is the provider or what is the name of the office in which the patient attends annual eye exams? Referral 09/16/2021 If pt is not established with a provider, would they like to be referred to a provider to establish care? No .   Dental Screening: Recommended annual dental exams for proper oral hygiene  Community Resource Referral / Chronic Care Management: CRR  required this visit?  No   CCM required this visit?  No      Plan:     I have personally reviewed and noted the following in the patient's chart:   Medical and social history Use of alcohol, tobacco or illicit drugs  Current medications and supplements including opioid prescriptions.  Functional ability and status Nutritional status Physical activity Advanced directives List of other physicians Hospitalizations, surgeries, and ER visits in previous 12 months Vitals Screenings to include cognitive, depression, and falls Referrals and appointments  In addition, I have reviewed and discussed with patient certain preventive protocols, quality metrics, and best practice recommendations. A written personalized care plan for preventive services as well as general preventive health recommendations were provided to patient.     Randel Pigg, LPN   [id number]   Nurse Notes: none

## 2021-09-16 NOTE — Patient Instructions (Signed)
Phyllis Johnson , Thank you for taking time to come for your Medicare Wellness Visit. I appreciate your ongoing commitment to your health goals. Please review the following plan we discussed and let me know if I can assist you in the future.   Screening recommendations/referrals: Colonoscopy: referral 09/16/2021 Mammogram: 07/29/2021 Bone Density: 07/17/2019 Recommended yearly ophthalmology/optometry visit for glaucoma screening and checkup Recommended yearly dental visit for hygiene and checkup  Vaccinations: Influenza vaccine: declined  Pneumococcal vaccine: declined  Tdap vaccine: due  Shingles vaccine: declined     Advanced directives: none   Conditions/risks identified: none   Next appointment: none    Preventive Care 55 Years and Older, Female Preventive care refers to lifestyle choices and visits with your health care provider that can promote health and wellness. What does preventive care include? A yearly physical exam. This is also called an annual well check. Dental exams once or twice a year. Routine eye exams. Ask your health care provider how often you should have your eyes checked. Personal lifestyle choices, including: Daily care of your teeth and gums. Regular physical activity. Eating a healthy diet. Avoiding tobacco and drug use. Limiting alcohol use. Practicing safe sex. Taking low-dose aspirin every day. Taking vitamin and mineral supplements as recommended by your health care provider. What happens during an annual well check? The services and screenings done by your health care provider during your annual well check will depend on your age, overall health, lifestyle risk factors, and family history of disease. Counseling  Your health care provider may ask you questions about your: Alcohol use. Tobacco use. Drug use. Emotional well-being. Home and relationship well-being. Sexual activity. Eating habits. History of falls. Memory and ability to  understand (cognition). Work and work Statistician. Reproductive health. Screening  You may have the following tests or measurements: Height, weight, and BMI. Blood pressure. Lipid and cholesterol levels. These may be checked every 5 years, or more frequently if you are over 72 years old. Skin check. Lung cancer screening. You may have this screening every year starting at age 68 if you have a 30-pack-year history of smoking and currently smoke or have quit within the past 15 years. Fecal occult blood test (FOBT) of the stool. You may have this test every year starting at age 68. Flexible sigmoidoscopy or colonoscopy. You may have a sigmoidoscopy every 5 years or a colonoscopy every 10 years starting at age 68. Hepatitis C blood test. Hepatitis B blood test. Sexually transmitted disease (STD) testing. Diabetes screening. This is done by checking your blood sugar (glucose) after you have not eaten for a while (fasting). You may have this done every 1-3 years. Bone density scan. This is done to screen for osteoporosis. You may have this done starting at age 42. Mammogram. This may be done every 1-2 years. Talk to your health care provider about how often you should have regular mammograms. Talk with your health care provider about your test results, treatment options, and if necessary, the need for more tests. Vaccines  Your health care provider may recommend certain vaccines, such as: Influenza vaccine. This is recommended every year. Tetanus, diphtheria, and acellular pertussis (Tdap, Td) vaccine. You may need a Td booster every 10 years. Zoster vaccine. You may need this after age 68. Pneumococcal 13-valent conjugate (PCV13) vaccine. One dose is recommended after age 68. Pneumococcal polysaccharide (PPSV23) vaccine. One dose is recommended after age 68. Talk to your health care provider about which screenings and vaccines you need and how  often you need them. This information is not  intended to replace advice given to you by your health care provider. Make sure you discuss any questions you have with your health care provider. Document Released: 10/24/2015 Document Revised: 06/16/2016 Document Reviewed: 07/29/2015 Elsevier Interactive Patient Education  2017 [place] Prevention in the Home Falls can cause injuries. They can happen to people of all ages. There are many things you can do to make your home safe and to help prevent falls. What can I do on the outside of my home? Regularly fix the edges of walkways and driveways and fix any cracks. Remove anything that might make you trip as you walk through a door, such as a raised step or threshold. Trim any bushes or trees on the path to your home. Use bright outdoor lighting. Clear any walking paths of anything that might make someone trip, such as rocks or tools. Regularly check to see if handrails are loose or broken. Make sure that both sides of any steps have handrails. Any raised decks and porches should have guardrails on the edges. Have any leaves, snow, or ice cleared regularly. Use sand or salt on walking paths during winter. Clean up any spills in your garage right away. This includes oil or grease spills. What can I do in the bathroom? Use night lights. Install grab bars by the toilet and in the tub and shower. Do not use towel bars as grab bars. Use non-skid mats or decals in the tub or shower. If you need to sit down in the shower, use a plastic, non-slip stool. Keep the floor dry. Clean up any water that spills on the floor as soon as it happens. Remove soap buildup in the tub or shower regularly. Attach bath mats securely with double-sided non-slip rug tape. Do not have throw rugs and other things on the floor that can make you trip. What can I do in the bedroom? Use night lights. Make sure that you have a light by your bed that is easy to reach. Do not use any sheets or blankets that are  too big for your bed. They should not hang down onto the floor. Have a firm chair that has side arms. You can use this for support while you get dressed. Do not have throw rugs and other things on the floor that can make you trip. What can I do in the kitchen? Clean up any spills right away. Avoid walking on wet floors. Keep items that you use a lot in easy-to-reach places. If you need to reach something above you, use a strong step stool that has a grab bar. Keep electrical cords out of the way. Do not use floor polish or wax that makes floors slippery. If you must use wax, use non-skid floor wax. Do not have throw rugs and other things on the floor that can make you trip. What can I do with my stairs? Do not leave any items on the stairs. Make sure that there are handrails on both sides of the stairs and use them. Fix handrails that are broken or loose. Make sure that handrails are as long as the stairways. Check any carpeting to make sure that it is firmly attached to the stairs. Fix any carpet that is loose or worn. Avoid having throw rugs at the top or bottom of the stairs. If you do have throw rugs, attach them to the floor with carpet tape. Make sure that you have a  light switch at the top of the stairs and the bottom of the stairs. If you do not have them, ask someone to add them for you. What else can I do to help prevent falls? Wear shoes that: Do not have high heels. Have rubber bottoms. Are comfortable and fit you well. Are closed at the toe. Do not wear sandals. If you use a stepladder: Make sure that it is fully opened. Do not climb a closed stepladder. Make sure that both sides of the stepladder are locked into place. Ask someone to hold it for you, if possible. Clearly mark and make sure that you can see: Any grab bars or handrails. First and last steps. Where the edge of each step is. Use tools that help you move around (mobility aids) if they are needed. These  include: Canes. Walkers. Scooters. Crutches. Turn on the lights when you go into a dark area. Replace any light bulbs as soon as they burn out. Set up your furniture so you have a clear path. Avoid moving your furniture around. If any of your floors are uneven, fix them. If there are any pets around you, be aware of where they are. Review your medicines with your doctor. Some medicines can make you feel dizzy. This can increase your chance of falling. Ask your doctor what other things that you can do to help prevent falls. This information is not intended to replace advice given to you by your health care provider. Make sure you discuss any questions you have with your health care provider. Document Released: 07/24/2009 Document Revised: 03/04/2016 Document Reviewed: 11/01/2014 Elsevier Interactive Patient Education  2017 [employer].

## 2021-09-28 ENCOUNTER — Encounter: Payer: Self-pay | Admitting: Gastroenterology

## 2021-10-13 ENCOUNTER — Telehealth: Payer: Self-pay

## 2021-10-13 NOTE — Progress Notes (Signed)
Chronic Care Management Pharmacy Assistant   Name: Phyllis Johnson  MRN: [id number] DOB: 10-07-1951  Chart Review for Clinical Pharmacist on 10/15/2021 at 2:00 pm.  Conditions to be addressed/monitored: HTN, Anxiety, and Right renal artery stenosis,   Primary concerns for visit include: Back pain Hemorrhoids   Recent office visits:  09/16/2021 Randel Pigg LPN (PCP Office) Medicare Wellness completed, Ambulatory referral to Ophthalmology, Ambulatory referral to  Gastroenterology 07/13/2021 Wilfred Lacy NP (PCP) Patient reports not taking Hydralazine. 06/04/2021 Wilfred Lacy NP (PCP)  No Medication Changes noted, Return in about 4 weeks   Recent consult visits:  No recent consult visit  Hospital visits:  Medication Reconciliation was completed by comparing discharge summary, patients EMR and Pharmacy list, and upon discussion with patient.  Admitted to the hospital on 05/29/2021 due to Labile hypertension. Discharge date was 05/29/2021. Discharged from [place]?Medications Started at [facility] Discharge:?? -started Hydralazine 10 mg PRN  Medication Changes at Hospital Discharge: -Changed None  Medications Discontinued at Hospital Discharge: -Stopped None  Medications that remain the same after Hospital Discharge:??  -All other medications will remain the same.    Have you seen any other providers since your last visit?   Patient denies seeing any other providers since her last visit.  Any changes in your medications or health?   Patient denies any changes with her medications or health.  Any side effects from any medications?   Patient denies any side effects from her medications.  Do you have an symptoms or problems not managed by your medications?   Patient reports she has back pain on her left side mid way.Patient states it is only on her left side and feels very "tight". Patient reports she is unsure if it is a pulled muscle  because once she rest her pain ease up some.Patient states this has been going on for years.  Any concerns about your health right now?   Patient reports concerns for her back pain, and Hemorrhoids.   Has your provider asked that you check blood pressure, blood sugar, or follow special diet at home?   Patient states she checks her blood pressure at home daily.  Do you get any type of exercise on a regular basis?   Patient states he exercise daily.  Can you think of a goal you would like to reach for your health?   Patient will inform clinical pharmacist.  Do you have any problems getting your medications?   Patient denies any problems or issue getting her medications.  Is there anything that you would like to discuss during the appointment?   Back pain  Hemorrhoids  Please bring medications and supplements to appointment  Patient is aware to have her medication and supplements at her appointment.  Patient is aware she can return my call if she has any questions/concerns.  Medications: Outpatient Encounter Medications as of 10/13/2021  Medication Sig   amLODipine (NORVASC) 5 MG tablet Take 1 tablet (5 mg total) by mouth daily.   hydrOXYzine (ATARAX/VISTARIL) 25 MG tablet Take 1 tablet (25 mg total) by mouth at bedtime as needed for anxiety.   Multiple Vitamin (MULTIVITAMIN) tablet Take 1 tablet by mouth daily.   Omega-3 Fatty Acids (FISH OIL PO) Take by mouth.   valsartan (DIOVAN) 40 MG tablet TAKE 1 TO 2 TABLETS BY MOUTH DAILY, 1 TAB IF BP<140/80, AND 2 TABS IF BP>150/90   No facility-administered encounter medications on file as of 10/13/2021.  Care Gaps: Pneumonia Vaccine COVID- 19 Vaccine Colonscopy  Star Rating Drugs: Valsartan 40 mg last filled on [facility]/04/20[facility]  Medication Fill Gaps: None ID  [name] Clinical Pharmacist Assistant [phone]

## 2021-10-15 ENCOUNTER — Telehealth: Payer: Medicare Other

## 2021-10-15 ENCOUNTER — Telehealth: Payer: Self-pay

## 2021-10-15 NOTE — Progress Notes (Deleted)
Chronic Care Management Pharmacy Note  10/15/2021 Name:  Kinisha Soper MRN:  [id number] DOB:  Jul 04, 1951  Summary: ***  Recommendations/Changes made from today's visit: ***  Plan: ***   Subjective: Phyllis Johnson is an 71 y.o. year old female who is a primary patient of Nche, Bonna Gains, NP.  The CCM team was consulted for assistance with disease management and care coordination needs.    Engaged with patient by telephone for initial visit in response to provider referral for pharmacy case management and/or care coordination services.   Consent to Services:  The patient was given the following information about Chronic Care Management services today, agreed to services, and gave verbal consent: 1. CCM service includes personalized support from designated clinical staff supervised by the primary care provider, including individualized plan of care and coordination with other care providers 2. 24/7 contact phone numbers for assistance for urgent and routine care needs. 3. Service will only be billed when office clinical staff spend 20 minutes or more in a month to coordinate care. 4. Only one practitioner may furnish and bill the service in a calendar month. 5.The patient may stop CCM services at any time (effective at the end of the month) by phone call to the office staff. 6. The patient will be responsible for cost sharing (co-pay) of up to 20% of the service fee (after annual deductible is met). Patient agreed to services and consent obtained.  Patient Care Team: Nche, Bonna Gains, NP as PCP - General (Internal Medicine) Gaspar Cola, [facility] as Pharmacist (Pharmacist)  Recent office visits: 09/16/2021 March Rummage LPN (PCP Office) Medicare Wellness completed, Ambulatory referral to Ophthalmology, Ambulatory referral to  Gastroenterology 07/13/2021 Alysia Penna NP (PCP) Patient reports not taking Hydralazine. 06/04/2021 Alysia Penna NP (PCP)  No Medication  Changes noted, Return in about 4 weeks   Recent consult visits: No recent consult visit  Hospital visits: Admitted to the hospital on 05/29/2021 due to Labile hypertension. Discharge date was 05/29/2021. Discharged from [facility]     New?Medications Started at [facility] Discharge:?? -started Hydralazine 10 mg PRN   Medication Changes at Hospital Discharge: -Changed None   Medications Discontinued at Hospital Discharge: -Stopped None   Medications that remain the same after Hospital Discharge:??  -All other medications will remain the same.   Objective:  Lab Results  Component Value Date   CREATININE 0.76 05/29/2021   BUN 7 (L) 05/29/2021   GFR 97.83 10/23/2019   GFRNONAA >60 05/29/2021   GFRAA >60 08/25/2018   NA 137 05/29/2021   K 3.5 05/29/2021   CALCIUM 9.2 05/29/2021   CO2 24 05/29/2021   GLUCOSE 116 (H) 05/29/2021    Lab Results  Component Value Date/Time   HGBA1C 6.2 07/14/2021 09:40 AM   HGBA1C 6.0 10/23/2019 01:55 PM   GFR 97.83 10/23/2019 01:55 PM   GFR 92.58 01/13/2018 10:51 AM    Last diabetic Eye exam: No results found for: HMDIABEYEEXA  Last diabetic Foot exam: No results found for: HMDIABFOOTEX   Lab Results  Component Value Date   CHOL 204 (H) 07/14/2021   HDL 87.80 07/14/2021   LDLCALC 104 (H) 07/14/2021   TRIG 65.0 07/14/2021   CHOLHDL 2 07/14/2021    Hepatic Function Latest Ref Rng & Units 05/29/2021 10/23/2019 01/13/2018  Total Protein 6.5 - 8.1 g/dL 6.8 - 7.0  Albumin 3.5 - 5.0 g/dL 4.2 4.4 4.4  AST 15 - 41 U/L 16 - 16  ALT 0 -  44 U/L 11 - 14  Alk Phosphatase 38 - 126 U/L 94 - 108  Total Bilirubin 0.3 - 1.2 mg/dL 0.8 - 0.6    Lab Results  Component Value Date/Time   TSH 1.08 04/17/2019 10:33 AM   TSH 0.53 11/05/2016 04:47 PM   FREET4 0.75 11/05/2016 04:47 PM    CBC Latest Ref Rng & Units 05/29/2021 08/27/2018 08/25/2018  WBC 4.0 - 10.5 K/uL 4.5 - 6.4  Hemoglobin 12.0 - 15.0 g/dL 14.5 15.0 13.8  Hematocrit 36.0  - 46.0 % 45.0 44.0 42.9  Platelets 150 - 400 K/uL 339 - 318    No results found for: VD25OH  Clinical ASCVD: No  The ASCVD Risk score (Arnett DK, et al., 2019) failed to calculate for the following reasons:   The systolic blood pressure is missing    Depression screen [facility] 2/9 09/16/2021 07/13/2021 07/03/2020  Decreased Interest 0 0 0  Down, Depressed, Hopeless 0 0 0  PHQ - 2 Score 0 0 0  Altered sleeping - - -  Tired, decreased energy - - -  Change in appetite - - -  Feeling bad or failure about yourself  - - -  Trouble concentrating - - -  Moving slowly or fidgety/restless - - -  Suicidal thoughts - - -  PHQ-9 Score - - -    Social History   Tobacco Use  Smoking Status Former   Packs/day: 1.50   Years: 30.00   Pack years: 45.00   Types: Cigarettes   Quit date: 2008   Years since quitting: 15.0  Smokeless Tobacco Never   BP Readings from Last 3 Encounters:  07/13/21 114/60  06/04/21 108/80  05/29/21 (!) 139/92   Pulse Readings from Last 3 Encounters:  07/13/21 64  06/04/21 60  05/29/21 (!) 46   Wt Readings from Last 3 Encounters:  07/13/21 135 lb 3.2 oz (61.3 kg)  06/04/21 135 lb (61.2 kg)  09/01/20 138 lb 9.6 oz (62.9 kg)   BMI Readings from Last 3 Encounters:  07/13/21 28.75 kg/m  06/04/21 27.27 kg/m  09/01/20 27.99 kg/m    Assessment/Interventions: Review of patient past medical history, allergies, medications, health status, including review of consultants reports, laboratory and other test data, was performed as part of comprehensive evaluation and provision of chronic care management services.   SDOH:  (Social Determinants of Health) assessments and interventions performed: Yes  SDOH Screenings   Alcohol Screen: Low Risk    Last Alcohol Screening Score (AUDIT): 0  Depression (PHQ2-9): Low Risk    PHQ-2 Score: 0  Financial Resource Strain: Low Risk    Difficulty of Paying Living Expenses: Not hard at all  Food Insecurity: No Food Insecurity    Worried About Charity fundraiser in the Last Year: Never true   Ran Out of Food in the Last Year: Never true  Housing: Low Risk    Last Housing Risk Score: 0  Physical Activity: Sufficiently Active   Days of Exercise per Week: 4 days   Minutes of Exercise per Session: 60 min  Social Connections: Moderately Isolated   Frequency of Communication with Friends and Family: Twice a week   Frequency of Social Gatherings with Friends and Family: Twice a week   Attends Religious Services: More than 4 times per year   Active Member of [employer] or Organizations: No   Attends Archivist Meetings: Never   Marital Status: Separated  Stress: No Stress Concern Present   Feeling of Stress :  Not at all  Tobacco Use: Medium Risk   Smoking Tobacco Use: Former   Smokeless Tobacco Use: Never   Passive Exposure: Not on file  Transportation Needs: No Transportation Needs   Lack of Transportation (Medical): No   Lack of Transportation (Non-Medical): No    CCM Care Plan  No Known Allergies  Medications Reviewed Today     Reviewed by Randel Pigg, LPN (Licensed Practical Nurse) on 09/16/21 at 1250  Med List Status: <None>   Medication Order Taking? Sig Documenting Provider Last Dose Status Informant  amLODipine (NORVASC) 5 MG tablet [id number] Yes Take 1 tablet (5 mg total) by mouth daily. Nche, Charlene Brooke, NP Taking Active   hydrOXYzine (ATARAX/VISTARIL) 25 MG tablet [id number] Yes Take 1 tablet (25 mg total) by mouth at bedtime as needed for anxiety. Flossie Buffy, NP Taking Active   Multiple Vitamin (MULTIVITAMIN) tablet [id number] Yes Take 1 tablet by mouth daily. [provider] Taking Active   Omega-3 Fatty Acids (FISH OIL PO) [id number] Yes Take by mouth. [provider] Taking Active   valsartan (DIOVAN) 40 MG tablet [id number] Yes TAKE 1 TO 2 TABLETS BY MOUTH DAILY, 1 TAB IF BP<140/80, AND 2 TABS IF BP>150/90 Nche, Charlene Brooke, NP Taking Active              Patient Active Problem List   Diagnosis Date Noted   Right renal artery stenosis ([place]) 07/03/2019   GAD (generalized anxiety disorder) 06/27/2019   Stress at home 03/07/2018   Atypical chest pain 11/11/2016   Abnormal EKG 11/11/2016   Situational anxiety 09/29/2016   HTN (hypertension), benign 08/31/2016   Chronic bronchitis with acute exacerbation ([place]) 08/31/2016   Chronic allergic rhinitis 08/31/2016    Immunization History  Administered Date(s) Administered   Moderna Sars-Covid-2 Vaccination 03/04/2020, 04/04/2020    Conditions to be addressed/monitored:  Hypertension, Anxiety, and Allergic Rhinitis  There are no care plans that you recently modified to display for this patient.    Medication Assistance: {MEDASSISTANCEINFO:25044}  Compliance/Adherence/Medication fill history: Care Gaps: Pneumonia Vaccine COVID- 19 Vaccine Colonscopy  Star-Rating Drugs: Valsartan 40 mg last filled on [facility]/04/[facility]  Patient[facility]  [id number] [employer] [place] - [address] [place] [address] Phone: [phone] Fax: [phone]  Uses pill box? {Yes or If no, why not?:20788} Pt endorses ***% compliance  We discussed: {Pharmacy options:24294} Patient decided to: {US Pharmacy Plan:23885}  Care Plan and Follow Up Patient Decision:  {FOLLOWUP:24991}  Plan: {CM FOLLOW UP TGPQ:98264}  ***  Current Barriers:  {pharmacybarriers:24917}  Pharmacist Clinical Goal(s):  Patient will {PHARMACYGOALCHOICES:24921} through collaboration with PharmD and provider.   Interventions: 1:1 collaboration with Nche, Charlene Brooke, NP regarding development and update of comprehensive plan of care as evidenced by provider attestation and co-signature Inter-disciplinary care team collaboration (see longitudinal plan of care) Comprehensive medication review performed;  medication list updated in electronic medical record  Hypertension (BP goal {CHL HP UPSTREAM Pharmacist BP ranges:[phone]}) -{US controlled/uncontrolled:25276} -Current treatment: Amlodipine 5 mg daily  Valsartan 40 mg daily  -Medications previously tried: ***  -Current home readings: *** -Current dietary habits: *** -Current exercise habits: *** -{ACTIONS;DENIES/REPORTS:21021675::"Denies"} hypotensive/hypertensive symptoms -Educated on {CCM BP Counseling:25124} -Counseled to monitor BP at home ***, document, and provide log at future appointments -{CCMPHARMDINTERVENTION:25122}  Depression/Anxiety (Goal: ***) -{US controlled/uncontrolled:25276} -Current treatment: Hydroxyzine 25 mg nightly as needed -Medications previously tried/failed: *** -PHQ9: *** -GAD7: *** -Connected with ***  for mental health support -Educated on {CCM mental health counseling:25127} -{CCMPHARMDINTERVENTION:25122}  Patient Goals/Self-Care Activities Patient will:  - {pharmacypatientgoals:24919}  Follow Up Plan: {CM FOLLOW UP AGTX:64680}

## 2021-10-15 NOTE — Telephone Encounter (Signed)
°  Care Management   Follow Up Note   10/15/2021 Name: Phyllis Johnson MRN: [id number] DOB: 1950-11-17   Referred by: Flossie Buffy, NP Reason for referral : No chief complaint on file.   An unsuccessful telephone outreach was attempted today. The patient was referred to the case management team for assistance with care management and care coordination.   Follow Up Plan: A HIPPA compliant phone message was left for the patient providing contact information and requesting a return call.   Junius Argyle, PharmD, Para [place], CPP Clinical Pharmacist Practitioner  [facility]  [phone]

## 2021-10-20 ENCOUNTER — Telehealth: Payer: Self-pay

## 2021-10-20 NOTE — Progress Notes (Signed)
I  reach out to patient by phone to reschedule her Initial appointment with the clinical pharmacist that was on 10/15/2021.  Patient agreed and reschedule her telephone initial appointment with the clinical pharmacist on 11/19/2021 at 1:00 pm.  [place] Pharmacist Assistant [phone]

## 2021-11-02 ENCOUNTER — Other Ambulatory Visit: Payer: Self-pay

## 2021-11-02 ENCOUNTER — Ambulatory Visit (AMBULATORY_SURGERY_CENTER): Payer: Medicare Other | Admitting: *Deleted

## 2021-11-02 VITALS — Ht <= 58 in | Wt 133.0 lb

## 2021-11-02 DIAGNOSIS — Z1211 Encounter for screening for malignant neoplasm of colon: Secondary | ICD-10-CM

## 2021-11-02 MED ORDER — PEG 3350-KCL-NA BICARB-NACL 420 G PO SOLR: 4000.0000 mL | Freq: Once | ORAL | 0 refills | Status: AC

## 2021-11-02 NOTE — Progress Notes (Signed)

## 2021-11-11 ENCOUNTER — Encounter: Payer: Self-pay | Admitting: Gastroenterology

## 2021-11-15 ENCOUNTER — Encounter: Payer: Self-pay | Admitting: Gastroenterology

## 2021-11-17 ENCOUNTER — Ambulatory Visit (AMBULATORY_SURGERY_CENTER): Payer: Medicare Other | Admitting: Gastroenterology

## 2021-11-17 ENCOUNTER — Other Ambulatory Visit: Payer: Self-pay

## 2021-11-17 ENCOUNTER — Encounter: Payer: Self-pay | Admitting: Gastroenterology

## 2021-11-17 VITALS — BP 108/77 | HR 63 | Temp 97.3°F | Resp 12 | Ht <= 58 in | Wt 133.0 lb

## 2021-11-17 DIAGNOSIS — Z1211 Encounter for screening for malignant neoplasm of colon: Secondary | ICD-10-CM | POA: Diagnosis not present

## 2021-11-17 DIAGNOSIS — D120 Benign neoplasm of cecum: Secondary | ICD-10-CM

## 2021-11-17 MED ORDER — SODIUM CHLORIDE 0.9 % IV SOLN: 500.0000 mL | Freq: Once | INTRAVENOUS | Status: DC

## 2021-11-17 NOTE — Progress Notes (Signed)
Vss nad trans to pacu °

## 2021-11-17 NOTE — Progress Notes (Signed)
[facility] History and Physical   Primary Care Physician:  Nche, Charlene Brooke, NP   Reason for Procedure:   Colon cancer screening  Plan:    colonoscopy     HPI: Phyllis Johnson is a 71 y.o. female  here for colonoscopy screening first time exam. Patient denies any bowel symptoms at this time. Occasional hemorrhoid symptoms.  No family history of colon cancer known. Otherwise feels well without any cardiopulmonary symptoms.    Past Medical History:  Diagnosis Date   Allergy    Anxiety    under control with prayer and exercise.   Arrhythmia    "irregular heart beat" during stress test in [place]   Chronic bronchitis ([place])    COPD (chronic obstructive pulmonary disease) ([place])    Depression    in remission with counselling   Hypertension     Past Surgical History:  Procedure Laterality Date   ABDOMINAL HYSTERECTOMY     with oophrectomy, secondary to large uterine fibriods   BREAST EXCISIONAL BIOPSY Right    COLONOSCOPY  10/2010    Prior to Admission medications   Medication Sig Start Date End Date Taking? Authorizing Provider  amLODipine (NORVASC) 5 MG tablet Take 1 tablet (5 mg total) by mouth daily. 06/04/21  Yes Nche, Charlene Brooke, NP  valsartan (DIOVAN) 40 MG tablet TAKE 1 TO 2 TABLETS BY MOUTH DAILY, 1 TAB IF BP<140/80, AND 2 TABS IF BP>150/90 06/04/21  Yes Nche, Charlene Brooke, NP  Multiple Vitamin (MULTIVITAMIN) tablet Take 1 tablet by mouth daily.    [provider]  Omega-3 Fatty Acids (FISH OIL PO) Take by mouth.    [provider]    Current Outpatient Medications  Medication Sig Dispense Refill   amLODipine (NORVASC) 5 MG tablet Take 1 tablet (5 mg total) by mouth daily. 90 tablet 3   valsartan (DIOVAN) 40 MG tablet TAKE 1 TO 2 TABLETS BY MOUTH DAILY, 1 TAB IF BP<140/80, AND 2 TABS IF BP>150/90 180 tablet 3   Multiple Vitamin (MULTIVITAMIN) tablet Take 1 tablet by mouth daily.     Omega-3 Fatty Acids (FISH OIL PO) Take by  mouth.     Current Facility-Administered Medications  Medication Dose Route Frequency Provider Last Rate Last Admin   0.9 %  sodium chloride infusion  500 mL Intravenous Once Dmitri Pettigrew, Carlota Raspberry, MD       0.9 %  sodium chloride infusion  500 mL Intravenous Once Doran Stabler, MD        Allergies as of 11/17/2021   (No Known Allergies)    Family History  Problem Relation Age of Onset   Alcohol abuse Mother    Hypertension Father    Stroke Father        hemorrhagic   Heart disease Father    Heart attack Father 18   COPD Sister    Cancer Sister        lung secondary to tobacco use   Fibroids Sister        uterine   Cancer Cousin        breast   Colon cancer Neg Hx    Colon polyps Neg Hx     Social History   Socioeconomic History   Marital status: Married    Spouse name: Not on file   Number of children: 4   Years of education: Not on file   Highest education level: Not on file  Occupational History   Occupation: retired  Tobacco Use  Smoking status: Former    Packs/day: 1.50    Years: 30.00    Pack years: 45.00    Types: Cigarettes    Quit date: 2008    Years since quitting: 15.1   Smokeless tobacco: Never  Vaping Use   Vaping Use: Never used  Substance and Sexual Activity   Alcohol use: No   Drug use: No    Comment: Clean since 1993; previous crack-cocaine   Sexual activity: Not Currently  Other Topics Concern   Not on file  Social History Narrative   Not on file   Social Determinants of Health   Financial Resource Strain: Low Risk    Difficulty of Paying Living Expenses: Not hard at all  Food Insecurity: No Food Insecurity   Worried About Charity fundraiser in the Last Year: Never true   [place] in the Last Year: Never true  Transportation Needs: No Transportation Needs   Lack of Transportation (Medical): No   Lack of Transportation (Non-Medical): No  Physical Activity: Sufficiently Active   Days of Exercise per Week: 4 days    Minutes of Exercise per Session: 60 min  Stress: No Stress Concern Present   Feeling of Stress : Not at all  Social Connections: Moderately Isolated   Frequency of Communication with Friends and Family: Twice a week   Frequency of Social Gatherings with Friends and Family: Twice a week   Attends Religious Services: More than 4 times per year   Active Member of [employer] or Organizations: No   Attends Archivist Meetings: Never   Marital Status: Separated  Intimate Partner Violence: Not At Risk   Fear of Current or Ex-Partner: No   Emotionally Abused: No   Physically Abused: No   Sexually Abused: No    Review of Systems: All other review of systems negative except as mentioned in the HPI.  Physical Exam: Vital signs BP 126/72    Pulse 64    Temp (!) 97.3 F (36.3 C)    Ht 4' 9.5" (1.461 m)    Wt 133 lb (60.3 kg)    SpO2 94%    BMI 28.28 kg/m   General:   Alert,  Well-developed, pleasant and cooperative in NAD Lungs:  Clear throughout to auscultation.   Heart:  Regular rate and rhythm Abdomen:  Soft, nontender and nondistended.   Neuro/Psych:  Alert and cooperative. Normal mood and affect. A and O x 3  Jolly Mango, MD [facility]

## 2021-11-17 NOTE — Patient Instructions (Signed)
Resume previous diet and medications. Awaiting pathology results. Repeat Colonoscopy date to be determined based on pathology results for surveillance.  YOU HAD AN ENDOSCOPIC PROCEDURE TODAY AT [facility]:   Refer to the procedure report that was given to you for any specific questions about what was found during the examination.  If the procedure report does not answer your questions, please call your gastroenterologist to clarify.  If you requested that your care partner not be given the details of your procedure findings, then the procedure report has been included in a sealed envelope for you to review at your convenience later.  YOU SHOULD EXPECT: Some feelings of bloating in the abdomen. Passage of more gas than usual.  Walking can help get rid of the air that was put into your GI tract during the procedure and reduce the bloating. If you had a lower endoscopy (such as a colonoscopy or flexible sigmoidoscopy) you may notice spotting of blood in your stool or on the toilet paper. If you underwent a bowel prep for your procedure, you may not have a normal bowel movement for a few days.  Please Note:  You might notice some irritation and congestion in your nose or some drainage.  This is from the oxygen used during your procedure.  There is no need for concern and it should clear up in a day or so.  SYMPTOMS TO REPORT IMMEDIATELY:  Following lower endoscopy (colonoscopy or flexible sigmoidoscopy):  Excessive amounts of blood in the stool  Significant tenderness or worsening of abdominal pains  Swelling of the abdomen that is new, acute  Fever of 100F or higher  For urgent or emergent issues, a gastroenterologist can be reached at any hour by calling [phone]. Do not use MyChart messaging for urgent concerns.    DIET:  We do recommend a small meal at first, but then you may proceed to your regular diet.  Drink plenty of fluids but you should avoid alcoholic beverages  for 24 hours.  ACTIVITY:  You should plan to take it easy for the rest of today and you should NOT DRIVE or use heavy machinery until tomorrow (because of the sedation medicines used during the test).    FOLLOW UP: Our staff will call the number listed on your records 48-72 hours following your procedure to check on you and address any questions or concerns that you may have regarding the information given to you following your procedure. If we do not reach you, we will leave a message.  We will attempt to reach you two times.  During this call, we will ask if you have developed any symptoms of COVID 19. If you develop any symptoms (ie: fever, flu-like symptoms, shortness of breath, cough etc.) before then, please call [phone].  If you test positive for Covid 19 in the 2 weeks post procedure, please call and report this information to [place].    If any biopsies were taken you will be contacted by phone or by letter within the next 1-3 weeks.  Please call us at [phone] if you have not heard about the biopsies in 3 weeks.    SIGNATURES/CONFIDENTIALITY: You and/or your care partner have signed paperwork which will be entered into your electronic medical record.  These signatures attest to the fact that that the information above on your After Visit Summary has been reviewed and is understood.  Full responsibility of the confidentiality of this discharge information lies with you and/or your  care-partner.

## 2021-11-17 NOTE — Op Note (Signed)
[facility] Patient Name: Phyllis Johnson Procedure Date: 11/17/2021 2:17 PM MRN: [id number] Endoscopist: Remo Lipps P. Havery Moros , MD Age: 71 Referring MD:  Date of Birth: October 21, 1950 Gender: Female Account #: [id number] Procedure:                Colonoscopy Indications:              Screening for colorectal malignant neoplasm Medicines:                Monitored Anesthesia Care Procedure:                Pre-Anesthesia Assessment:                           - Prior to the procedure, a History and Physical                            was performed, and patient medications and                            allergies were reviewed. The patient's tolerance of                            previous anesthesia was also reviewed. The risks                            and benefits of the procedure and the sedation                            options and risks were discussed with the patient.                            All questions were answered, and informed consent                            was obtained. Prior Anticoagulants: The patient has                            taken no previous anticoagulant or antiplatelet                            agents. ASA Grade Assessment: III - A patient with                            severe systemic disease. After reviewing the risks                            and benefits, the patient was deemed in                            satisfactory condition to undergo the procedure.                           After obtaining informed consent, the colonoscope  was passed under direct vision. Throughout the                            procedure, the patient's blood pressure, pulse, and                            oxygen saturations were monitored continuously. The                            PCF-HQ190L Colonoscope was introduced through the                            anus and advanced to the the cecum, identified by                             appendiceal orifice and ileocecal valve. The                            colonoscopy was performed without difficulty. The                            patient tolerated the procedure well. The quality                            of the bowel preparation was good. The ileocecal                            valve, appendiceal orifice, and rectum were                            photographed. Scope In: 2:36:16 PM Scope Out: 2:51:30 PM Scope Withdrawal Time: 0 hours 13 minutes 7 seconds  Total Procedure Duration: 0 hours 15 minutes 14 seconds  Findings:                 The perianal and digital rectal examinations were                            normal.                           A 3 mm polyp was found in the cecum. The polyp was                            sessile. The polyp was removed with a cold snare.                            Resection and retrieval were complete.                           Multiple small-mouthed diverticula were found in                            the sigmoid colon and ascending colon.  Internal hemorrhoids were found during retroflexion.                           The exam was otherwise without abnormality. Complications:            No immediate complications. Estimated blood loss:                            Minimal. Estimated Blood Loss:     Estimated blood loss was minimal. Impression:               - One 3 mm polyp in the cecum, removed with a cold                            snare. Resected and retrieved.                           - Diverticulosis in the sigmoid colon and in the                            ascending colon.                           - Internal hemorrhoids.                           - The examination was otherwise normal. Recommendation:           - Patient has a contact number available for                            emergencies. The signs and symptoms of potential                            delayed complications were discussed with  the                            patient. Return to normal activities tomorrow.                            Written discharge instructions were provided to the                            patient.                           - Resume previous diet.                           - Continue present medications.                           - Await pathology results. Remo Lipps P. Havery Moros, MD 11/17/2021 2:54:28 PM This report has been signed electronically.

## 2021-11-17 NOTE — Progress Notes (Signed)
Pt's states no medical or surgical changes since previsit or office visit. 

## 2021-11-18 ENCOUNTER — Telehealth: Payer: Self-pay

## 2021-11-18 NOTE — Progress Notes (Signed)
Chronic Care Management  APPOINTMENT REMINDER   Phyllis Johnson was reminded to have all medications, supplements and any blood glucose and blood pressure readings available for review with Junius Argyle, Pharm. D, at her telephone visit on 11/19/2021 at 1:00 pm.  Patient confirm [name] Pharmacist Assistant [phone]

## 2021-11-19 ENCOUNTER — Ambulatory Visit (INDEPENDENT_AMBULATORY_CARE_PROVIDER_SITE_OTHER): Payer: Medicare Other

## 2021-11-19 ENCOUNTER — Telehealth: Payer: Self-pay

## 2021-11-19 DIAGNOSIS — F411 Generalized anxiety disorder: Secondary | ICD-10-CM

## 2021-11-19 DIAGNOSIS — I10 Essential (primary) hypertension: Secondary | ICD-10-CM

## 2021-11-19 NOTE — Telephone Encounter (Signed)
°  Follow up Call-  Call back number 11/17/2021  Post procedure Call Back phone  # [phone]  Permission to leave phone message No  Some recent data might be hidden     Patient questions:  Do you have a fever, pain , or abdominal swelling? No. Pain Score  0 *  Have you tolerated food without any problems? Yes.    Have you been able to return to your normal activities? Yes.    Do you have any questions about your discharge instructions: Diet   No. Medications  No. Follow up visit  No.  Do you have questions or concerns about your Care? No.  Actions: * If pain score is 4 or above: No action needed, pain <4.  Have you developed a fever since your procedure? no  2.   Have you had an respiratory symptoms (SOB or cough) since your procedure? no  3.   Have you tested positive for COVID 19 since your procedure no  4.   Have you had any family members/close contacts diagnosed with the COVID 19 since your procedure?  no   If yes to any of these questions please route to Joylene John, RN and Joella Prince, RN

## 2021-11-19 NOTE — Progress Notes (Signed)
Chronic Care Management Pharmacy Note  11/19/2021 Name:  Phyllis Johnson MRN:  [id number] DOB:  25-Feb-1951  Summary: Patient presents for initial CCM consult.   -Patient failed adherence metric for valsartan in 2022 due to supply lasting ~6 months. She is likely to fail metric in 2023 if directions are not changed.   -Patient due for repeat DEXA scan.   Recommendations/Changes made from today's visit: -Recommend changing to valsartan to 40 mg daily. -If she needs additional blood pressure coverage, would recommend an additional 2.5 mg of amlodipine PRN.   Plan: CPP follow-up 6 months   Recommended Problem List Changes:  Add: Prediabetes Osteopenia of multiple sites  Subjective: Phyllis Johnson is an 71 y.o. year old female who is a primary patient of Nche, Charlene Brooke, NP.  The CCM team was consulted for assistance with disease management and care coordination needs.    Engaged with patient by telephone for initial visit in response to provider referral for pharmacy case management and/or care coordination services.   Consent to Services:  The patient was given the following information about Chronic Care Management services today, agreed to services, and gave verbal consent: 1. CCM service includes personalized support from designated clinical staff supervised by the primary care provider, including individualized plan of care and coordination with other care providers 2. 24/7 contact phone numbers for assistance for urgent and routine care needs. 3. Service will only be billed when office clinical staff spend 20 minutes or more in a month to coordinate care. 4. Only one practitioner may furnish and bill the service in a calendar month. 5.The patient may stop CCM services at any time (effective at the end of the month) by phone call to the office staff. 6. The patient will be responsible for cost sharing (co-pay) of up to 20% of the service fee (after annual deductible is  met). Patient agreed to services and consent obtained.  Patient Care Team: Nche, Charlene Brooke, NP as PCP - General (Internal Medicine) Germaine Pomfret, [facility] as Pharmacist (Pharmacist)  Recent office visits: 09/16/2021 Randel Pigg LPN (PCP Office) Medicare Wellness completed, Ambulatory referral to Ophthalmology, Ambulatory referral to  Gastroenterology 07/13/2021 Wilfred Lacy NP (PCP) Patient reports not taking Hydralazine. 06/04/2021 Wilfred Lacy NP (PCP)  No Medication Changes noted, Return in about 4 weeks   Recent consult visits: No recent consult visit  Hospital visits: Admitted to the hospital on 05/29/2021 due to Labile hypertension.   New?Medications Started at [facility] Discharge:?? -started Hydralazine 10 mg PRN   Objective:  Lab Results  Component Value Date   CREATININE 0.76 05/29/2021   BUN 7 (L) 05/29/2021   GFR 97.83 10/23/2019   GFRNONAA >60 05/29/2021   GFRAA >60 08/25/2018   NA 137 05/29/2021   K 3.5 05/29/2021   CALCIUM 9.2 05/29/2021   CO2 24 05/29/2021   GLUCOSE 116 (H) 05/29/2021    Lab Results  Component Value Date/Time   HGBA1C 6.2 07/14/2021 09:40 AM   HGBA1C 6.0 10/23/2019 01:55 PM   GFR 97.83 10/23/2019 01:55 PM   GFR 92.58 01/13/2018 10:51 AM    Last diabetic Eye exam: No results found for: HMDIABEYEEXA  Last diabetic Foot exam: No results found for: HMDIABFOOTEX   Lab Results  Component Value Date   CHOL 204 (H) 07/14/2021   HDL 87.80 07/14/2021   LDLCALC 104 (H) 07/14/2021   TRIG 65.0 07/14/2021   CHOLHDL 2 07/14/2021    Hepatic Function Latest Ref Rng & Units 05/29/2021  10/23/2019 01/13/2018  Total Protein 6.5 - 8.1 g/dL 6.8 - 7.0  Albumin 3.5 - 5.0 g/dL 4.2 4.4 4.4  AST 15 - 41 U/L 16 - 16  ALT 0 - 44 U/L 11 - 14  Alk Phosphatase 38 - 126 U/L 94 - 108  Total Bilirubin 0.3 - 1.2 mg/dL 0.8 - 0.6    Lab Results  Component Value Date/Time   TSH 1.08 04/17/2019 10:33 AM   TSH 0.53 11/05/2016 04:47 PM   FREET4 0.75  11/05/2016 04:47 PM    CBC Latest Ref Rng & Units 05/29/2021 08/27/2018 08/25/2018  WBC 4.0 - 10.5 K/uL 4.5 - 6.4  Hemoglobin 12.0 - 15.0 g/dL 14.5 15.0 13.8  Hematocrit 36.0 - 46.0 % 45.0 44.0 42.9  Platelets 150 - 400 K/uL 339 - 318    No results found for: VD25OH  Clinical ASCVD: No  The 10-year ASCVD risk score (Arnett DK, et al., 2019) is: 7.6%   Values used to calculate the score:     Age: 71 years     Sex: Female     Is Non-Hispanic African American: Yes     Diabetic: No     Tobacco smoker: No     Systolic Blood Pressure: 976 mmHg     Is BP treated: Yes     HDL Cholesterol: 87.8 mg/dL     Total Cholesterol: 204 mg/dL    Depression screen [facility] 2/9 09/16/2021 07/13/2021 07/03/2020  Decreased Interest 0 0 0  Down, Depressed, Hopeless 0 0 0  PHQ - 2 Score 0 0 0  Altered sleeping - - -  Tired, decreased energy - - -  Change in appetite - - -  Feeling bad or failure about yourself  - - -  Trouble concentrating - - -  Moving slowly or fidgety/restless - - -  Suicidal thoughts - - -  PHQ-9 Score - - -    -Last DEXA Scan: 08/06/19   T-Score femoral neck: -2.1  T-Score total hip: -1.9  T-Score lumbar spine: -1.7  T-Score forearm radius: NA  10-year probability of major osteoporotic fracture: 5.2%  10-year probability of hip fracture: 0.9%  Social History   Tobacco Use  Smoking Status Former   Packs/day: 1.50   Years: 30.00   Pack years: 45.00   Types: Cigarettes   Quit date: 2008   Years since quitting: 15.1  Smokeless Tobacco Never   BP Readings from Last 3 Encounters:  11/17/21 108/77  07/13/21 114/60  06/04/21 108/80   Pulse Readings from Last 3 Encounters:  11/17/21 63  07/13/21 64  06/04/21 60   Wt Readings from Last 3 Encounters:  11/17/21 133 lb (60.3 kg)  11/02/21 133 lb (60.3 kg)  07/13/21 135 lb 3.2 oz (61.3 kg)   BMI Readings from Last 3 Encounters:  11/17/21 28.28 kg/m  11/02/21 28.28 kg/m  07/13/21 28.75 kg/m     Assessment/Interventions: Review of patient past medical history, allergies, medications, health status, including review of consultants reports, laboratory and other test data, was performed as part of comprehensive evaluation and provision of chronic care management services.   SDOH:  (Social Determinants of Health) assessments and interventions performed: Yes SDOH Interventions    Flowsheet Row Most Recent Value  SDOH Interventions   Financial Strain Interventions Intervention Not Indicated      SDOH Screenings   Alcohol Screen: Low Risk    Last Alcohol Screening Score (AUDIT): 0  Depression (PHQ2-9): Low Risk    PHQ-2 Score:  0  Financial Resource Strain: Low Risk    Difficulty of Paying Living Expenses: Not hard at all  Food Insecurity: No Food Insecurity   Worried About Charity fundraiser in the Last Year: Never true   Ran Out of Food in the Last Year: Never true  Housing: Low Risk    Last Housing Risk Score: 0  Physical Activity: Sufficiently Active   Days of Exercise per Week: 4 days   Minutes of Exercise per Session: 60 min  Social Connections: Moderately Isolated   Frequency of Communication with Friends and Family: Twice a week   Frequency of Social Gatherings with Friends and Family: Twice a week   Attends Religious Services: More than 4 times per year   Active Member of [employer] or Organizations: No   Attends Music therapist: Never   Marital Status: Separated  Stress: No Stress Concern Present   Feeling of Stress : Not at all  Tobacco Use: Medium Risk   Smoking Tobacco Use: Former   Smokeless Tobacco Use: Never   Passive Exposure: Not on file  Transportation Needs: No Transportation Needs   Lack of Transportation (Medical): No   Lack of Transportation (Non-Medical): No    CCM Care Plan  No Known Allergies  Medications Reviewed Today     Reviewed by Leodis Binet, RN (Registered Nurse) on 11/17/21 at 79  Med List Status: <None>    Medication Order Taking? Sig Documenting Provider Last Dose Status Informant     Discontinued 11/17/21 1523 (Completed Course)      Discontinued 11/17/21 1523 (Completed Course)   amLODipine (NORVASC) 5 MG tablet [id number] Yes Take 1 tablet (5 mg total) by mouth daily. Flossie Buffy, NP 11/16/2021 Active   Multiple Vitamin (MULTIVITAMIN) tablet [id number] No Take 1 tablet by mouth daily. [provider] Unknown Active   Omega-3 Fatty Acids (FISH OIL PO) [id number] No Take by mouth. [provider] Unknown Active   valsartan (DIOVAN) 40 MG tablet [id number] Yes TAKE 1 TO 2 TABLETS BY MOUTH DAILY, 1 TAB IF BP<140/80, AND 2 TABS IF BP>150/90 Nche, Charlene Brooke, NP 11/17/2021 Active             Patient Active Problem List   Diagnosis Date Noted   Right renal artery stenosis ([place]) 07/03/2019   GAD (generalized anxiety disorder) 06/27/2019   Stress at home 03/07/2018   Atypical chest pain 11/11/2016   Abnormal EKG 11/11/2016   Situational anxiety 09/29/2016   HTN (hypertension), benign 08/31/2016   Chronic bronchitis with acute exacerbation ([place]) 08/31/2016   Chronic allergic rhinitis 08/31/2016    Immunization History  Administered Date(s) Administered   Moderna Sars-Covid-2 Vaccination 03/04/2020, 04/04/2020    Conditions to be addressed/monitored:  Hypertension and Anxiety  Care Plan : General Pharmacy (Adult)  Updates made by Germaine Pomfret, RPH since 11/19/2021 12:00 AM     Problem: Hypertension and Anxiety   Priority: High     Long-Range Goal: Patient-Specific Goal   Start Date: 11/19/2021  Expected End Date: 11/19/2022  This Visit's Progress: On track  Priority: High  Note:   Current Barriers:  No barriers noted  Pharmacist Clinical Goal(s):  Patient will maintain control of blood pressure as evidenced by BP less than 140/90  through collaboration with PharmD and provider.   Interventions: 1:1 collaboration with Nche, Charlene Brooke, NP  regarding development and update of comprehensive plan of care as evidenced by provider attestation and co-signature Inter-disciplinary care team collaboration (see  longitudinal plan of care) Comprehensive medication review performed; medication list updated in electronic medical record  Hypertension (BP goal <130/80) -Controlled -Current treatment: Amlodipine 5 mg nightly: Appropriate, Effective, Safe, Accessible Valsartan 40 mg 1-2 tablets daily: Appropriate, Effective, Safe, Accessible  -Medications previously tried: Hydralazine, HCTZ   -Most days only takes valsartan 40 mg daily. Requires second dose only 1-2 times monthly.  -Current home readings: 140/80s in the AM, lower throughout the day -Current dietary habits: Portion control, multiple servings of vegetables. Does season food with some salt.  -Current exercise habits: Walks 4 times weekly, also works out at gym  -Denies hypotensive/hypertensive symptoms -Patient failed adherence metric for valsartan in 2022 due to supply lasting ~6 months. She is likely to fail metric in 2023 if directions are not changed.  -Recommend changing to valsartan to 40 mg daily. Blood pressure appears well controlled on this regimen, but if she needs additional PRN coverage, would recommend an additional 2.5 mg of amlodipine.   Anxiety (Goal: Maintain symptom remission) -Controlled -Current treatment: None -Medications previously tried/failed: Hydroxyzine, Escitalopram -GAD7: 0 -Recommended to continue current medication  Prediabetes (A1c goal <6.5%) -Controlled -Current medications: None -Medications previously tried: None  -Counseled on diet and exercise extensively  Osteopenia (Goal Prevent Fractures) -Controlled -Patient is not a candidate for pharmacologic treatment -Current treatment  Centrum Silver (1000 units Vitamin D3; 200 mg calcium) -Medications previously tried: NA  -Recommend 1200 mg of calcium daily from dietary and  supplemental sources. -Recheck DEXA  -Recommended to continue current medication  Patient Goals/Self-Care Activities Patient will:  - check blood pressure weekly, document, and provide at future appointments  Follow Up Plan: Telephone follow up appointment with care management team member scheduled for:  05/13/2022 at 2:15 PM      Medication Assistance: None required.  Patient affirms current coverage meets needs.  Compliance/Adherence/Medication fill history: Care Gaps: Pneumonia Vaccine COVID- 19 Vaccine Colonscopy  Star-Rating Drugs: Valsartan 40 mg last filled on 06/04/2021 [facility]  Patient's preferred pharmacy is:  [id number] - [place], [address] AT [address] [place] [address] Phone: [phone] Fax: [phone]  Uses pill box? No Pt endorses 100% compliance  We discussed: Current pharmacy is preferred with insurance plan and patient is satisfied with pharmacy services Patient decided to: Continue current medication management strategy  Care Plan and Follow Up Patient Decision:  Patient agrees to Care Plan and Follow-up.  Plan: Telephone follow up appointment with care management team member scheduled for:  05/13/2022 at 2:15 PM  Junius Argyle, PharmD, Para March, CPP Clinical Pharmacist Practitioner  [facility] at [facility]  [phone]

## 2021-11-19 NOTE — Patient Instructions (Signed)
Visit Information It was great speaking with you today!  Please let me know if you have any questions about our visit.   Goals Addressed             This Visit's Progress    Track and Manage My Blood Pressure-Hypertension       Timeframe:  Long-Range Goal Priority:  High Start Date: 11/19/2021                            Expected End Date: 11/19/2022                      Follow Up within 90 days   - check blood pressure weekly    Why is this important?   You won't feel high blood pressure, but it can still hurt your blood vessels.  High blood pressure can cause heart or kidney problems. It can also cause a stroke.  Making lifestyle changes like losing a little weight or eating less salt will help.  Checking your blood pressure at home and at different times of the day can help to control blood pressure.  If the doctor prescribes medicine remember to take it the way the doctor ordered.  Call the office if you cannot afford the medicine or if there are questions about it.     Notes:         Patient Care Plan: General Pharmacy (Adult)     Problem Identified: Hypertension and Anxiety   Priority: High     Long-Range Goal: Patient-Specific Goal   Start Date: 11/19/2021  Expected End Date: 11/19/2022  This Visit's Progress: On track  Priority: High  Note:   Current Barriers:  No barriers noted  Pharmacist Clinical Goal(s):  Patient will maintain control of blood pressure as evidenced by BP less than 140/90  through collaboration with PharmD and provider.   Interventions: 1:1 collaboration with Nche, Charlene Brooke, NP regarding development and update of comprehensive plan of care as evidenced by provider attestation and co-signature Inter-disciplinary care team collaboration (see longitudinal plan of care) Comprehensive medication review performed; medication list updated in electronic medical record  Hypertension (BP goal <130/80) -Controlled -Current  treatment: Amlodipine 5 mg nightly: Appropriate, Effective, Safe, Accessible Valsartan 40 mg 1-2 tablets daily: Appropriate, Effective, Safe, Accessible  -Medications previously tried: Hydralazine, HCTZ   -Most days only takes valsartan 40 mg daily. Requires second dose only 1-2 times monthly.  -Current home readings: 140/80s in the AM, lower throughout the day -Current dietary habits: Portion control, multiple servings of vegetables. Does season food with some salt.  -Current exercise habits: Walks 4 times weekly, also works out at gym  -Denies hypotensive/hypertensive symptoms -Patient failed adherence metric for valsartan in 2022 due to supply lasting ~6 months. She is likely to fail metric in 2023 if directions are not changed.  -Recommend changing to valsartan to 40 mg daily. Blood pressure appears well controlled on this regimen, but if she needs additional PRN coverage, would recommend an additional 2.5 mg of amlodipine.   Anxiety (Goal: Maintain symptom remission) -Controlled -Current treatment: None -Medications previously tried/failed: Hydroxyzine, Escitalopram -GAD7: 0 -Recommended to continue current medication  Prediabetes (A1c goal <6.5%) -Controlled -Current medications: None -Medications previously tried: None  -Counseled on diet and exercise extensively  Osteopenia (Goal Prevent Fractures) -Controlled -Patient is not a candidate for pharmacologic treatment -Current treatment  Centrum Silver (1000 units Vitamin D3; 200 mg calcium) -Medications  previously tried: NA  -Recommend 1200 mg of calcium daily from dietary and supplemental sources. -Recheck DEXA  -Recommended to continue current medication  Patient Goals/Self-Care Activities Patient will:  - check blood pressure weekly, document, and provide at future appointments  Follow Up Plan: Telephone follow up appointment with care management team member scheduled for:  05/13/2022 at 2:15 PM    Patient agreed to  services and verbal consent obtained.   Patient verbalizes understanding of instructions and care plan provided today and agrees to view in [place]. Active MyChart status confirmed with patient.    Junius Argyle, PharmD, Para March, CPP Clinical Pharmacist Practitioner  [facility]  [phone]

## 2021-11-30 ENCOUNTER — Other Ambulatory Visit: Payer: Self-pay

## 2021-11-30 DIAGNOSIS — Z9289 Personal history of other medical treatment: Secondary | ICD-10-CM

## 2021-11-30 DIAGNOSIS — Z1231 Encounter for screening mammogram for malignant neoplasm of breast: Secondary | ICD-10-CM

## 2021-12-08 DIAGNOSIS — I10 Essential (primary) hypertension: Secondary | ICD-10-CM | POA: Diagnosis not present

## 2021-12-08 DIAGNOSIS — F411 Generalized anxiety disorder: Secondary | ICD-10-CM | POA: Diagnosis not present

## 2022-01-12 ENCOUNTER — Ambulatory Visit (INDEPENDENT_AMBULATORY_CARE_PROVIDER_SITE_OTHER): Payer: Medicare Other | Admitting: Nurse Practitioner

## 2022-01-12 ENCOUNTER — Encounter: Payer: Self-pay | Admitting: Nurse Practitioner

## 2022-01-12 VITALS — BP 136/80 | HR 60 | Temp 96.8°F | Ht <= 58 in | Wt 136.0 lb

## 2022-01-12 DIAGNOSIS — R739 Hyperglycemia, unspecified: Secondary | ICD-10-CM | POA: Diagnosis not present

## 2022-01-12 DIAGNOSIS — J410 Simple chronic bronchitis: Secondary | ICD-10-CM | POA: Diagnosis not present

## 2022-01-12 DIAGNOSIS — I10 Essential (primary) hypertension: Secondary | ICD-10-CM

## 2022-01-12 DIAGNOSIS — E7800 Pure hypercholesterolemia, unspecified: Secondary | ICD-10-CM

## 2022-01-12 HISTORY — DX: Pure hypercholesterolemia, unspecified: E78.00

## 2022-01-12 LAB — LIPID PANEL
Cholesterol: 215 mg/dL — ABNORMAL HIGH (ref 0–200)
HDL: 96.5 mg/dL (ref >39.00)
LDL Cholesterol: 108 mg/dL — ABNORMAL HIGH (ref 0–99)
NonHDL: 118.54
Total CHOL/HDL Ratio: 2
Triglycerides: 55 mg/dL (ref 0.0–149.0)
VLDL: 11 mg/dL (ref 0.0–40.0)

## 2022-01-12 LAB — HEMOGLOBIN A1C: Hgb A1c MFr Bld: 6 % (ref 4.6–6.5)

## 2022-01-12 MED ORDER — VALSARTAN 40 MG PO TABS: 40.0000 mg | ORAL_TABLET | Freq: Every day | ORAL | 3 refills | Status: DC

## 2022-01-12 MED ORDER — AMLODIPINE BESYLATE 5 MG PO TABS: 5.0000 mg | ORAL_TABLET | Freq: Every day | ORAL | 3 refills | Status: DC

## 2022-01-12 NOTE — Assessment & Plan Note (Signed)
Repeat lipid panel ?

## 2022-01-12 NOTE — Patient Instructions (Addendum)
Maintain current medications. ? ?Go to lab for blood draw ?

## 2022-01-12 NOTE — Assessment & Plan Note (Signed)
Controlled ?No cough, no SOB, no wheezing. ?No need for ICS or SABA ?

## 2022-01-12 NOTE — Assessment & Plan Note (Signed)
BP at goal ?No adverse effect with valsartan and amlodipine ?BP Readings from Last 3 Encounters:  ?01/12/22 136/80  ?11/17/21 108/77  ?07/13/21 114/60  ? ?Maintain med dose ?F/up in 102month ?

## 2022-01-12 NOTE — Assessment & Plan Note (Signed)
Repeat hgbA1c 

## 2022-01-12 NOTE — Progress Notes (Signed)
? ?             Established Patient Visit ? ?Patient: Phyllis Johnson   DOB: 1951/03/02   71 y.o. Female  MRN: [id number] ?Visit Date: 01/12/2022 ? ?Subjective:  ?  ?Chief Complaint  ?Patient presents with  ? Follow-up  ?  6 month f/u on HTN.   ? ?HPI ?Chronic bronchitis ([place]) ?Controlled ?No cough, no SOB, no wheezing. ?No need for ICS or SABA ? ?HTN (hypertension), benign ?BP at goal ?No adverse effect with valsartan and amlodipine ?BP Readings from Last 3 Encounters:  ?01/12/22 136/80  ?11/17/21 108/77  ?07/13/21 114/60  ? ?Maintain med dose ?F/up in 97month ? ?Hyperglycemia ?Repeat hgbA1c ? ?Pure hypercholesterolemia ?Repeat lipid panel ? ? ?Reviewed medical, surgical, and social history today ? ?Medications: ?Outpatient Medications Prior to Visit  ?Medication Sig  ? Multiple Vitamin (MULTIVITAMIN) tablet Take 1 tablet by mouth daily.  ? Omega-3 Fatty Acids (FISH OIL PO) Take by mouth.  ? [DISCONTINUED] amLODipine (NORVASC) 5 MG tablet Take 1 tablet (5 mg total) by mouth daily.  ? [DISCONTINUED] valsartan (DIOVAN) 40 MG tablet TAKE 1 TO 2 TABLETS BY MOUTH DAILY, 1 TAB IF BP<140/80, AND 2 TABS IF BP>150/90  ? ?No facility-administered medications prior to visit.  ? ?Reviewed past medical and social history.  ? ?ROS per HPI above ? ? ?   ?Objective:  ?BP 136/80 (BP Location: Left Arm, Patient Position: Sitting, Cuff Size: Normal)   Pulse 60   Temp (!) 96.8 ?F (36 ?C) (Temporal)   Ht 4' 9.5" (1.461 m)   Wt 136 lb (61.7 kg)   SpO2 100%   BMI 28.92 kg/m?  ? ?  ? ?Physical Exam ?Vitals reviewed.  ?Cardiovascular:  ?   Rate and Rhythm: Normal rate and regular rhythm.  ?   Pulses: Normal pulses.  ?   Heart sounds: Normal heart sounds.  ?Pulmonary:  ?   Effort: Pulmonary effort is normal.  ?   Breath sounds: Normal breath sounds.  ?Musculoskeletal:  ?   Right lower leg: No edema.  ?   Left lower leg: No edema.  ?Neurological:  ?   Mental Status: She is alert and oriented to person, place, and time.  ?  ?No  results found for any visits on 01/12/22. ?   ?Assessment & Plan:  ?  ?Problem List Items Addressed This Visit   ? ?  ? Cardiovascular and Mediastinum  ? HTN (hypertension), benign - Primary  ?  BP at goal ?No adverse effect with valsartan and amlodipine ?BP Readings from Last 3 Encounters:  ?01/12/22 136/80  ?11/17/21 108/77  ?07/13/21 114/60  ? ?Maintain med dose ?F/up in 679month?  ?  ? Relevant Medications  ? valsartan (DIOVAN) 40 MG tablet  ? amLODipine (NORVASC) 5 MG tablet  ?  ? Respiratory  ? Chronic bronchitis ([place] ?  Controlled ?No cough, no SOB, no wheezing. ?No need for ICS or SABA ?  ?  ?  ? Other  ? Hyperglycemia  ?  Repeat hgbA1c ?  ?  ? Relevant Orders  ? Hemoglobin A1c  ? Pure hypercholesterolemia  ?  Repeat lipid panel ?  ?  ? Relevant Medications  ? valsartan (DIOVAN) 40 MG tablet  ? amLODipine (NORVASC) 5 MG tablet  ? Other Relevant Orders  ? Lipid panel  ? ?Return in about 6 months (around 07/14/2022) for HTN and , hyperlipidemia (fasting). ? ?  ? ?ChWilfred LacyNP ? ? ?

## 2022-02-10 ENCOUNTER — Telehealth: Payer: Self-pay

## 2022-02-10 NOTE — Progress Notes (Signed)
? ? ?  Chronic Care Management ?Pharmacy Assistant  ? ?Name: Phyllis Johnson  MRN: [id number] DOB: 1950-12-01 ? ?Reason for Encounter: Medication Review/General Adherence Call. ? ?Recent office visits:  ?01/12/2022 Wilfred Lacy NP (PCP) No Medication Changes noted, Return in about 6 months  ? ?Recent consult visits:  ?No recent consult visit ? ?Hospital visits:  ?None in previous 6 months ? ?Medications: ?Outpatient Encounter Medications as of 02/10/2022  ?Medication Sig  ? amLODipine (NORVASC) 5 MG tablet Take 1 tablet (5 mg total) by mouth daily.  ? Multiple Vitamin (MULTIVITAMIN) tablet Take 1 tablet by mouth daily.  ? Omega-3 Fatty Acids (FISH OIL PO) Take by mouth.  ? valsartan (DIOVAN) 40 MG tablet Take 1 tablet (40 mg total) by mouth daily. 1 TAB IF BP<140/80, AND 2 TABS IF BP>150/90  ? ?No facility-administered encounter medications on file as of 02/10/2022.  ? ? ?Care Gaps: ?COVID- 19 Vaccine ? ?  ?Star Rating Drugs: ?Valsartan 40 mg last filled on 12/11/2021 [facility] ?  ?Medication Fill Gaps: ?None ID ? ?Called patient and discussed medication adherence  with patient, no issues at this time with current medication. ? ? Patient Denies ED visit since her last CPP follow up.  ?Patient Denies  any side effects with her medication. ?Patient Denies  any problems with hercurrent pharmacy  ? ?Patient states she is doing "good". Patient denies any questions or concerns for the clinical pharmacist at this time. ? ?Patient reports she her blood pressure has been doing great, and has not needed to 2 tablets of Valsartan since her blood pressure has been lower then 150/90.Patient reports she is taking Valsartan 40 mg daily. ? ?Telephone follow up appointment with Care management team member scheduled for : 05/13/2022 at 2:15 pm. ? ?[name] ?Clinical Pharmacist Assistant ?[phone]  ? ?

## 2022-05-12 ENCOUNTER — Telehealth: Payer: Self-pay

## 2022-05-12 NOTE — Progress Notes (Signed)
Chronic Care Management APPOINTMENT REMINDER   Phyllis Johnson was reminded to have all medications, supplements and any blood glucose and blood pressure readings available for review with Junius Argyle, Pharm. D, at her telephone visit on 05/13/2022 at 2:15 pm.  Patient Confirm appointment.  [place] Pharmacist Assistant [phone]

## 2022-05-13 ENCOUNTER — Telehealth: Payer: Medicare Other

## 2022-05-20 ENCOUNTER — Ambulatory Visit: Payer: Medicare Other

## 2022-05-20 DIAGNOSIS — E7800 Pure hypercholesterolemia, unspecified: Secondary | ICD-10-CM

## 2022-05-20 DIAGNOSIS — I10 Essential (primary) hypertension: Secondary | ICD-10-CM

## 2022-05-20 NOTE — Progress Notes (Signed)
Chronic Care Management Pharmacy Note  05/20/2022 Name:  Phyllis Johnson MRN:  [id number] DOB:  [birth year]  Summary: Patient presents for CCM follow-up.   -Patient at moderate risk for heath disease given LDL >100 and ASCVD risk > 10%. Patient is a candidate for statin use but patient declines at this time due to concerns with medications. Extensive risks of heart disease was discussed.   Recommendations/Changes made from today's visit: -Recommend rechecking fasting lipid panel at PCP follow-up, re-evaluating appropriateness of statin treatment.  Plan: No further follow up required: Patient will reach out to clinical team for further clinical issues.   Subjective: Phyllis Johnson is an 71 y.o. year old female who is a primary patient of Nche, Charlene Brooke, NP.  The CCM team was consulted for assistance with disease management and care coordination needs.    Engaged with patient by telephone for follow up visit in response to provider referral for pharmacy case management and/or care coordination services.   Consent to Services:  The patient was given information about Chronic Care Management services, agreed to services, and gave verbal consent prior to initiation of services.  Please see initial visit note for detailed documentation.   Patient Care Team: Nche, Charlene Brooke, NP as PCP - General (Internal Medicine) Germaine Pomfret, [facility] as Pharmacist (Pharmacist)  Recent office visits: 01/12/22: Patient presented to Wilfred Lacy, NP for follow-up.   Recent consult visits: No recent consult visit  Hospital visits: Admitted to the hospital on 05/29/2021 due to Labile hypertension.   New?Medications Started at [facility] Discharge:?? -started Hydralazine 10 mg PRN  Objective:  Lab Results  Component Value Date   CREATININE 0.76 05/29/2021   BUN 7 (L) 05/29/2021   GFR 97.83 10/23/2019   GFRNONAA >60 05/29/2021   GFRAA >60 08/25/2018   NA 137 05/29/2021   K  3.5 05/29/2021   CALCIUM 9.2 05/29/2021   CO2 24 05/29/2021   GLUCOSE 116 (H) 05/29/2021    Lab Results  Component Value Date/Time   HGBA1C 6.0 01/12/2022 10:42 AM   HGBA1C 6.2 07/14/2021 09:40 AM   GFR 97.83 10/23/2019 01:55 PM   GFR 92.58 01/13/2018 10:51 AM    Last diabetic Eye exam: No results found for: "HMDIABEYEEXA"  Last diabetic Foot exam: No results found for: "HMDIABFOOTEX"   Lab Results  Component Value Date   CHOL 215 (H) 01/12/2022   HDL 96.50 01/12/2022   LDLCALC 108 (H) 01/12/2022   TRIG 55.0 01/12/2022   CHOLHDL 2 01/12/2022       Latest Ref Rng & Units 05/29/2021    9:28 AM 10/23/2019    1:55 PM 01/13/2018   10:51 AM  Hepatic Function  Total Protein 6.5 - 8.1 g/dL 6.8   7.0   Albumin 3.5 - 5.0 g/dL 4.2  4.4  4.4   AST 15 - 41 U/L 16   16   ALT 0 - 44 U/L 11   14   Alk Phosphatase 38 - 126 U/L 94   108   Total Bilirubin 0.3 - 1.2 mg/dL 0.8   0.6     Lab Results  Component Value Date/Time   TSH 1.08 04/17/2019 10:33 AM   TSH 0.53 11/05/2016 04:47 PM   FREET4 0.75 11/05/2016 04:47 PM       Latest Ref Rng & Units 05/29/2021    9:28 AM 08/27/2018    1:45 PM 08/25/2018    9:30 PM  CBC  WBC 4.0 - 10.5 K/uL 4.5  6.4   Hemoglobin 12.0 - 15.0 g/dL 14.5  15.0  13.8   Hematocrit 36.0 - 46.0 % 45.0  44.0  42.9   Platelets 150 - 400 K/uL 339   318     No results found for: "VD25OH"  Clinical ASCVD: No  The 10-year ASCVD risk score (Arnett DK, et al., 2019) is: 13.8%   Values used to calculate the score:     Age: 44 years     Sex: Female     Is Non-Hispanic African American: Yes     Diabetic: No     Tobacco smoker: No     Systolic Blood Pressure: 756 mmHg     Is BP treated: Yes     HDL Cholesterol: 96.5 mg/dL     Total Cholesterol: 215 mg/dL       09/16/2021   12:55 PM 07/13/2021   12:21 PM 07/03/2020    3:07 PM  Depression screen PHQ 2/9  Decreased Interest 0 0 0  Down, Depressed, Hopeless 0 0 0  PHQ - 2 Score 0 0 0    -Last DEXA Scan:  08/06/19   T-Score femoral neck: -2.1  T-Score total hip: -1.9  T-Score lumbar spine: -1.7  T-Score forearm radius: NA  10-year probability of major osteoporotic fracture: 5.2%  10-year probability of hip fracture: 0.9%  Social History   Tobacco Use  Smoking Status Former   Packs/day: 1.50   Years: 30.00   Total pack years: 45.00   Types: Cigarettes   Quit date: 2008   Years since quitting: 15.6  Smokeless Tobacco Never   BP Readings from Last 3 Encounters:  01/12/22 136/80  11/17/21 108/77  07/13/21 114/60   Pulse Readings from Last 3 Encounters:  01/12/22 60  11/17/21 63  07/13/21 64   Wt Readings from Last 3 Encounters:  01/12/22 136 lb (61.7 kg)  11/17/21 133 lb (60.3 kg)  11/02/21 133 lb (60.3 kg)   BMI Readings from Last 3 Encounters:  01/12/22 28.92 kg/m  11/17/21 28.28 kg/m  11/02/21 28.28 kg/m    Assessment/Interventions: Review of patient past medical history, allergies, medications, health status, including review of consultants reports, laboratory and other test data, was performed as part of comprehensive evaluation and provision of chronic care management services.   SDOH:  (Social Determinants of Health) assessments and interventions performed: Yes   SDOH Screenings   Alcohol Screen: Low Risk  (09/16/2021)   Alcohol Screen    Last Alcohol Screening Score (AUDIT): 0  Depression (PHQ2-9): Low Risk  (09/16/2021)   Depression (PHQ2-9)    PHQ-2 Score: 0  Financial Resource Strain: Low Risk  (11/19/2021)   Overall Financial Resource Strain (CARDIA)    Difficulty of Paying Living Expenses: Not hard at all  Food Insecurity: No Food Insecurity (09/16/2021)   Hunger Vital Sign    Worried About Running Out of Food in the Last Year: Never true    Ran Out of Food in the Last Year: Never true  Housing: Low Risk  (09/16/2021)   Housing    Last Housing Risk Score: 0  Physical Activity: Sufficiently Active (09/16/2021)   Exercise Vital Sign    Days of  Exercise per Week: 4 days    Minutes of Exercise per Session: 60 min  Social Connections: Moderately Isolated (09/16/2021)   Social Connection and Isolation Panel [NHANES]    Frequency of Communication with Friends and Family: Twice a week    Frequency of Social Gatherings with Friends and  Family: Twice a week    Attends Religious Services: More than 4 times per year    Active Member of Clubs or Organizations: No    Attends Archivist Meetings: Never    Marital Status: Separated  Stress: No Stress Concern Present (09/16/2021)   [place]    Feeling of Stress : Not at all  Tobacco Use: Medium Risk (01/12/2022)   Patient History    Smoking Tobacco Use: Former    Smokeless Tobacco Use: Never    Passive Exposure: Not on file  Transportation Needs: No Transportation Needs (09/16/2021)   PRAPARE - Hydrologist (Medical): No    Lack of Transportation (Non-Medical): No    CCM Care Plan  No Known Allergies  Medications Reviewed Today     Reviewed by Flossie Buffy, NP (Nurse Practitioner) on 01/12/22 at 1209  Med List Status: <None>   Medication Order Taking? Sig Documenting Provider Last Dose Status Informant  amLODipine (NORVASC) 5 MG tablet [id number]  Take 1 tablet (5 mg total) by mouth daily. Flossie Buffy, NP  Active   Multiple Vitamin (MULTIVITAMIN) tablet [id number] Yes Take 1 tablet by mouth daily. [provider] Taking Active   Omega-3 Fatty Acids (FISH OIL PO) [id number] Yes Take by mouth. [provider] Taking Active   valsartan (DIOVAN) 40 MG tablet [id number]  Take 1 tablet (40 mg total) by mouth daily. 1 TAB IF BP<140/80, AND 2 TABS IF BP>150/90 Nche, Charlene Brooke, NP  Active             Patient Active Problem List   Diagnosis Date Noted   Hyperglycemia 01/12/2022   Pure hypercholesterolemia 01/12/2022   GAD (generalized anxiety  disorder) 06/27/2019   Stress at home 03/07/2018   Atypical chest pain 11/11/2016   Abnormal EKG 11/11/2016   Situational anxiety 09/29/2016   HTN (hypertension), benign 08/31/2016   Chronic bronchitis ([place]) 08/31/2016   Chronic allergic rhinitis 08/31/2016    Immunization History  Administered Date(s) Administered   Moderna Sars-Covid-2 Vaccination 03/04/2020, 04/04/2020    Conditions to be addressed/monitored:  Hypertension, Hyperlipidemia, Anxiety, Osteopenia, and Prediabetes  Care Plan : General Pharmacy (Adult)  Updates made by Germaine Pomfret, RPH since 05/20/2022 12:00 AM     Problem: Hypertension, Hyperlipidemia, Anxiety, Osteopenia, and Prediabetes   Priority: High     Long-Range Goal: Patient-Specific Goal   Start Date: 11/19/2021  Expected End Date: 11/19/2022  This Visit's Progress: On track  Recent Progress: On track  Priority: High  Note:   Current Barriers:  No barriers noted  Pharmacist Clinical Goal(s):  Patient will maintain control of blood pressure as evidenced by BP less than 140/90  through collaboration with PharmD and provider.   Interventions: 1:1 collaboration with Nche, Charlene Brooke, NP regarding development and update of comprehensive plan of care as evidenced by provider attestation and co-signature Inter-disciplinary care team collaboration (see longitudinal plan of care) Comprehensive medication review performed; medication list updated in electronic medical record  Hypertension (BP goal <130/80) -Controlled -Current treatment: Amlodipine 5 mg nightly: Appropriate, Effective, Safe, Accessible Valsartan 40 mg daily: Appropriate, Effective, Safe, Accessible  -Medications previously tried: Hydralazine, HCTZ   -Current home readings: 120/85, highest in recall 130/85  -Current dietary habits: Portion control, multiple servings of vegetables. Does season food with some salt.  -Current exercise habits: Walks 4 times weekly, also works out at  gym  -Denies hypotensive/hypertensive symptoms  Hyperlipidemia: (LDL goal < 100) -Uncontrolled -Current treatment: None -Medications previously tried: NA  -Patient at moderate risk for heath disease given LDL >100 and ASCVD risk > 10%. Patient is a candidate for statin use but patient declines at this time due to generalized concerns with medications. Extensive risks of heart disease was discussed.  -Recommend rechecking fasting lipid panel at PCP follow-up, re-evaluating appropriateness of statin treatment.  Anxiety (Goal: Maintain symptom remission) -Controlled -Current treatment: None -Medications previously tried/failed: Hydroxyzine, Escitalopram -GAD7: 0 -Recommended to continue current medication  Prediabetes (A1c goal <6.5%) -Controlled -Current medications: None -Medications previously tried: None  -Counseled on diet and exercise extensively  Osteopenia (Goal Prevent Fractures) -Controlled -Patient is not a candidate for pharmacologic treatment -Current treatment  Centrum Silver (1000 units Vitamin D3; 200 mg calcium) -Medications previously tried: NA  -Recommend 1200 mg of calcium daily from dietary and supplemental sources. -Recheck DEXA  -Recommended to continue current medication  Patient Goals/Self-Care Activities Patient will:  - check blood pressure weekly, document, and provide at future appointments  Follow Up Plan: No further follow up required: Patient will reach out to clinical team for further clinical issues.     Medication Assistance: None required.  Patient affirms current coverage meets needs.  Compliance/Adherence/Medication fill history: Care Gaps: Pneumonia Vaccine COVID- 19 Vaccine Colonscopy  Star-Rating Drugs: Valsartan 40 mg last filled on 06/04/2021 [facility]  [facility]  [id number] [place], [place]. AT [address] [place] [address] Phone: [phone] Fax: [phone]  Uses pill box? No Pt endorses 100% compliance  We discussed: Current pharmacy is preferred with insurance plan and patient is satisfied with pharmacy services Patient decided to: Continue current medication management strategy  Care Plan and Follow Up Patient Decision:  Patient agrees to Care Plan and Follow-up.  Plan: No further follow up required: Patient will reach out to clinical team for further clinical issues.   Junius Argyle, PharmD, Para [place], CPP Clinical Pharmacist Practitioner  [facility] at [facility]  [phone]

## 2022-05-20 NOTE — Patient Instructions (Signed)
Visit Information It was great speaking with you today!  Please let me know if you have any questions about our visit.   Goals Addressed   None     Patient Care Plan: General Pharmacy (Adult)     Problem Identified: Hypertension, Hyperlipidemia, Anxiety, Osteopenia, and Prediabetes   Priority: High     Long-Range Goal: Patient-Specific Goal   Start Date: 11/19/2021  Expected End Date: 11/19/2022  This Visit's Progress: On track  Recent Progress: On track  Priority: High  Note:   Current Barriers:  No barriers noted  Pharmacist Clinical Goal(s):  Patient will maintain control of blood pressure as evidenced by BP less than 140/90  through collaboration with PharmD and provider.   Interventions: 1:1 collaboration with Nche, Charlene Brooke, NP regarding development and update of comprehensive plan of care as evidenced by provider attestation and co-signature Inter-disciplinary care team collaboration (see longitudinal plan of care) Comprehensive medication review performed; medication list updated in electronic medical record  Hypertension (BP goal <130/80) -Controlled -Current treatment: Amlodipine 5 mg nightly: Appropriate, Effective, Safe, Accessible Valsartan 40 mg daily: Appropriate, Effective, Safe, Accessible  -Medications previously tried: Hydralazine, HCTZ   -Current home readings: 120/85, highest in recall 130/85  -Current dietary habits: Portion control, multiple servings of vegetables. Does season food with some salt.  -Current exercise habits: Walks 4 times weekly, also works out at gym  -Denies hypotensive/hypertensive symptoms  Hyperlipidemia: (LDL goal < 100) -Uncontrolled -Current treatment: None -Medications previously tried: NA  -Patient at moderate risk for heath disease given LDL >100 and ASCVD risk > 10%. Patient is a candidate for statin use but patient declines at this time due to generalized concerns with medications. Extensive risks of heart disease  was discussed.  -Recommend rechecking fasting lipid panel at PCP follow-up, re-evaluating appropriateness of statin treatment.  Anxiety (Goal: Maintain symptom remission) -Controlled -Current treatment: None -Medications previously tried/failed: Hydroxyzine, Escitalopram -GAD7: 0 -Recommended to continue current medication  Prediabetes (A1c goal <6.5%) -Controlled -Current medications: None -Medications previously tried: None  -Counseled on diet and exercise extensively  Osteopenia (Goal Prevent Fractures) -Controlled -Patient is not a candidate for pharmacologic treatment -Current treatment  Centrum Silver (1000 units Vitamin D3; 200 mg calcium) -Medications previously tried: NA  -Recommend 1200 mg of calcium daily from dietary and supplemental sources. -Recheck DEXA  -Recommended to continue current medication  Patient Goals/Self-Care Activities Patient will:  - check blood pressure weekly, document, and provide at future appointments  Follow Up Plan: No further follow up required: Patient will reach out to clinical team for further clinical issues.     Patient agreed to services and verbal consent obtained.   Patient verbalizes understanding of instructions and care plan provided today and agrees to view in [place]. Active MyChart status and patient understanding of how to access instructions and care plan via MyChart confirmed with patient.     Junius Argyle, PharmD, [place], CPP Clinical Pharmacist Practitioner  [facility] at [facility]  [phone]

## 2022-05-31 DIAGNOSIS — H5203 Hypermetropia, bilateral: Secondary | ICD-10-CM | POA: Diagnosis not present

## 2022-06-05 ENCOUNTER — Other Ambulatory Visit: Payer: Self-pay | Admitting: Nurse Practitioner

## 2022-06-05 DIAGNOSIS — I10 Essential (primary) hypertension: Secondary | ICD-10-CM

## 2022-06-07 NOTE — Telephone Encounter (Signed)
Chart supports Rx Last OV: 01/2022 Next OV: not scheduled   30 day supply given, pt needs appt for further refills.

## 2022-06-20 ENCOUNTER — Other Ambulatory Visit: Payer: Self-pay | Admitting: Nurse Practitioner

## 2022-06-20 DIAGNOSIS — I10 Essential (primary) hypertension: Secondary | ICD-10-CM

## 2022-07-30 ENCOUNTER — Ambulatory Visit
Admission: RE | Admit: 2022-07-30 | Discharge: 2022-07-30 | Disposition: A | Discharge status: Home / Self Care | Payer: Medicare Other | Source: Ambulatory Visit | Attending: Nurse Practitioner | Admitting: Nurse Practitioner

## 2022-07-30 DIAGNOSIS — Z780 Asymptomatic menopausal state: Secondary | ICD-10-CM | POA: Diagnosis not present

## 2022-07-30 DIAGNOSIS — Z9289 Personal history of other medical treatment: Secondary | ICD-10-CM

## 2022-07-30 DIAGNOSIS — Z1231 Encounter for screening mammogram for malignant neoplasm of breast: Secondary | ICD-10-CM

## 2022-07-30 DIAGNOSIS — M810 Age-related osteoporosis without current pathological fracture: Secondary | ICD-10-CM | POA: Diagnosis not present

## 2022-07-30 DIAGNOSIS — M8589 Other specified disorders of bone density and structure, multiple sites: Secondary | ICD-10-CM | POA: Diagnosis not present

## 2022-08-16 ENCOUNTER — Telehealth: Payer: Self-pay | Admitting: Nurse Practitioner

## 2022-08-16 DIAGNOSIS — M810 Age-related osteoporosis without current pathological fracture: Secondary | ICD-10-CM

## 2022-08-16 MED ORDER — ALENDRONATE SODIUM 70 MG PO TABS: 70.0000 mg | ORAL_TABLET | ORAL | 3 refills | Status: DC

## 2022-08-16 NOTE — Telephone Encounter (Signed)
Pt called to return your call @ 9:50am today

## 2022-08-16 NOTE — Telephone Encounter (Signed)
Pt stated she decide that she would like the bone pill instead of injection

## 2022-08-16 NOTE — Telephone Encounter (Signed)
Phone line busy, will try again later

## 2022-08-17 NOTE — Telephone Encounter (Signed)
Pt has been notified of medications sent to pharmacy, as well as OTC medications that need to be taken with Fosamax. Pt has verbalized understanding.

## 2022-09-28 ENCOUNTER — Encounter: Payer: Self-pay | Admitting: Nurse Practitioner

## 2022-09-29 ENCOUNTER — Telehealth: Payer: Self-pay | Admitting: Nurse Practitioner

## 2022-09-29 NOTE — Telephone Encounter (Signed)
Called & spoke w/ pt, says she can only feel the lump when she stretches her leg out. Wanted to know if this issue could be discussed on her [place] on 10/15/21. Adv she would need a separate appt for this, scheduled pt w/ Dr. Gena Fray on 10/05/22

## 2022-09-29 NOTE — Telephone Encounter (Signed)
Pt has lump in back crease of left leg. the patient stated she was told to call to discuss the issue. Please give pt a call

## 2022-10-05 ENCOUNTER — Ambulatory Visit (INDEPENDENT_AMBULATORY_CARE_PROVIDER_SITE_OTHER): Payer: Medicare Other | Admitting: Family Medicine

## 2022-10-05 VITALS — BP 132/70 | HR 68 | Temp 97.9°F | Ht <= 58 in | Wt 134.6 lb

## 2022-10-05 DIAGNOSIS — R252 Cramp and spasm: Secondary | ICD-10-CM | POA: Diagnosis not present

## 2022-10-05 DIAGNOSIS — J069 Acute upper respiratory infection, unspecified: Secondary | ICD-10-CM | POA: Diagnosis not present

## 2022-10-05 NOTE — Progress Notes (Signed)
[facility] [address] [place] [address] Dept: [phone] Dept Fax: [phone]  Office Visit  Subjective:    Patient ID: Phyllis Johnson, female    DOB: [birth year], 71 y.o..   MRN: [id number]  Chief Complaint  Patient presents with   Acute Visit    C/o having a lump in LT lower leg x 2 weeks.  Also having a productive cough w/phlegm, ST, runny nose, eyes watering x 2 weeks.  Has been taking ginger, honey and lemon.  Neg home covid test      History of Present Illness:  Patient is in today for evaluation of a lump in her left calf. Phyllis Johnson is very active at the gym. She does regular stretches of her calf. She noted a lump had developed in the left calf. This was tender. She continue dot work at stretches She has noted some improvement more recently.  Over the past 2 weeks, she has developed some acute upper respiratory symptoms. She is having cough and runny nose. She notes she had some watery eyes, which has now improved. She ran a home COVID test which was negative. She is using honey, lemon, and ginger to manage symptoms.  Past Medical History: Patient Active Problem List   Diagnosis Date Noted   Hyperglycemia 01/12/2022   Pure hypercholesterolemia 01/12/2022   GAD (generalized anxiety disorder) 06/27/2019   Stress at home 03/07/2018   Atypical chest pain 11/11/2016   Abnormal EKG 11/11/2016   Situational anxiety 09/29/2016   HTN (hypertension), benign 08/31/2016   Chronic bronchitis ([place]) 08/31/2016   Chronic allergic rhinitis 08/31/2016   Past Surgical History:  Procedure Laterality Date   ABDOMINAL HYSTERECTOMY     with oophrectomy, secondary to large uterine fibriods   BREAST EXCISIONAL BIOPSY Right    COLONOSCOPY  10/2010   Family History  Problem Relation Age of Onset   Alcohol abuse Mother    Hypertension Father    Stroke Father        hemorrhagic   Heart disease Father    Heart  attack Father 75   COPD Sister    Cancer Sister        lung secondary to tobacco use   Fibroids Sister        uterine   Cancer Cousin        breast   Colon cancer Neg Hx    Colon polyps Neg Hx    Outpatient Medications Prior to Visit  Medication Sig Dispense Refill   alendronate (FOSAMAX) 70 MG tablet Take 1 tablet (70 mg total) by mouth every 7 (seven) days. Take with a full glass of water on an empty stomach. 12 tablet 3   amLODipine (NORVASC) 5 MG tablet Take 1 tablet (5 mg total) by mouth daily. 90 tablet 3   calcium carbonate (OSCAL) 1500 (600 Ca) MG TABS tablet Take 600 mg of elemental calcium by mouth 2 (two) times daily with a meal.     Multiple Vitamin (MULTIVITAMIN) tablet Take 1 tablet by mouth daily.     Omega-3 Fatty Acids (FISH OIL PO) Take by mouth.     valsartan (DIOVAN) 40 MG tablet TAKE 1 TO 2 TABLETS BY MOUTH DAILY, 1 TAB IF BP<140/80, AND 2 TABS IF BP>150/90 180 tablet 1   zinc gluconate 50 MG tablet Take 50 mg by mouth daily.     No facility-administered medications prior to visit.   No Known Allergies    Objective:  Today's Vitals   10/05/22 1042  BP: 132/70  Pulse: 68  Temp: 97.9 F (36.6 C)  TempSrc: Temporal  SpO2: 93%  Weight: 134 lb 9.6 oz (61.1 kg)  Height: 4' 9.5" (1.461 m)   Body mass index is 28.62 kg/m.   General: Well developed, well nourished. No acute distress. HEENT: Normocephalic, non-traumatic. Conjunctiva clear. External ears normal. EAC and TMs normal   bilaterally. Nose clear without congestion or rhinorrhea. Mucous membranes moist. Oropharynx clear.   Good dentition. Neck: Supple. No lymphadenopathy. No thyromegaly. Lungs: Clear to auscultation bilaterally. No wheezing, rales or rhonchi. Extremities: Full ROM of left lower leg. No joint swelling or tenderness. No edema noted. No palpable   nodule or mass in left gastrocnemius. Psych: Alert and oriented. Normal mood and affect.  Health Maintenance Due  Topic Date Due    DTaP/Tdap/Td (1 - Tdap) Never done   Medicare Annual Wellness (AWV)  09/16/2022     Assessment & Plan:   1. Cramp in lower leg No obvious issue on exam today. Cotninue stretches and warm-ups prior to exercise. Consider icing after activity until resolved.  2. Viral URI with cough Discussed home care for viral illness, including rest, pushing fluids, and OTC medications as needed for symptom relief. Recommend hot tea with honey for sore throat symptoms. Follow-up if needed for worsening or persistent symptoms.  Return if symptoms worsen or fail to improve.   Haydee Salter, MD

## 2022-10-13 DIAGNOSIS — K080 Exfoliation of teeth due to systemic causes: Secondary | ICD-10-CM | POA: Diagnosis not present

## 2022-10-15 ENCOUNTER — Ambulatory Visit (INDEPENDENT_AMBULATORY_CARE_PROVIDER_SITE_OTHER): Payer: Medicare Other

## 2022-10-15 VITALS — Ht <= 58 in | Wt 134.0 lb

## 2022-10-15 DIAGNOSIS — Z0000 Encounter for general adult medical examination without abnormal findings: Secondary | ICD-10-CM

## 2022-10-15 NOTE — Patient Instructions (Signed)
Phyllis Johnson , Thank you for taking time to come for your Medicare Wellness Visit. I appreciate your ongoing commitment to your health goals. Please review the following plan we discussed and let me know if I can assist you in the future.   These are the goals we discussed:  Goals      Exercise 150 min/wk Moderate Activity     Stay consistent with exercise and healthy diet.     Patient Stated     10/15/2022, wants to lose some weight     Track and Manage My Blood Pressure-Hypertension     Timeframe:  Long-Range Goal Priority:  High Start Date: 11/19/2021                            Expected End Date: 11/19/2022                      Follow Up within 90 days   - check blood pressure weekly    Why is this important?   You won't feel high blood pressure, but it can still hurt your blood vessels.  High blood pressure can cause heart or kidney problems. It can also cause a stroke.  Making lifestyle changes like losing a little weight or eating less salt will help.  Checking your blood pressure at home and at different times of the day can help to control blood pressure.  If the doctor prescribes medicine remember to take it the way the doctor ordered.  Call the office if you cannot afford the medicine or if there are questions about it.     Notes:         This is a list of the screening recommended for you and due dates:  Health Maintenance  Topic Date Due   DTaP/Tdap/Td vaccine (1 - Tdap) Never done   COVID-19 Vaccine (4 - 2023-24 season) 10/11/2022   Zoster (Shingles) Vaccine (2 of 2) 10/11/2022   Flu Shot  01/09/2023*   Pneumonia Vaccine (1 - PCV) 01/13/2023*   Mammogram  07/31/2023   Medicare Annual Wellness Visit  10/16/2023   Colon Cancer Screening  11/17/2028   DEXA scan (bone density measurement)  Completed   Hepatitis C Screening: USPSTF Recommendation to screen - Ages 59-79 yo.  Completed   HPV Vaccine  Aged Out  *Topic was postponed. The date shown is not the original  due date.    Advanced directives: Advance directive discussed with you today.   Conditions/risks identified: none  Next appointment: Follow up in one year for your annual wellness visit    Preventive Care 65 Years and Older, Female Preventive care refers to lifestyle choices and visits with your health care provider that can promote health and wellness. What does preventive care include? A yearly physical exam. This is also called an annual well check. Dental exams once or twice a year. Routine eye exams. Ask your health care provider how often you should have your eyes checked. Personal lifestyle choices, including: Daily care of your teeth and gums. Regular physical activity. Eating a healthy diet. Avoiding tobacco and drug use. Limiting alcohol use. Practicing safe sex. Taking low-dose aspirin every day. Taking vitamin and mineral supplements as recommended by your health care provider. What happens during an annual well check? The services and screenings done by your health care provider during your annual well check will depend on your age, overall health, lifestyle risk factors, and  family history of disease. Counseling  Your health care provider may ask you questions about your: Alcohol use. Tobacco use. Drug use. Emotional well-being. Home and relationship well-being. Sexual activity. Eating habits. History of falls. Memory and ability to understand (cognition). Work and work Statistician. Reproductive health. Screening  You may have the following tests or measurements: Height, weight, and BMI. Blood pressure. Lipid and cholesterol levels. These may be checked every 5 years, or more frequently if you are over 70 years old. Skin check. Lung cancer screening. You may have this screening every year starting at age 36 if you have a 30-pack-year history of smoking and currently smoke or have quit within the past 15 years. Fecal occult blood test (FOBT) of the stool.  You may have this test every year starting at age 23. Flexible sigmoidoscopy or colonoscopy. You may have a sigmoidoscopy every 5 years or a colonoscopy every 10 years starting at age 66. Hepatitis C blood test. Hepatitis B blood test. Sexually transmitted disease (STD) testing. Diabetes screening. This is done by checking your blood sugar (glucose) after you have not eaten for a while (fasting). You may have this done every 1-3 years. Bone density scan. This is done to screen for osteoporosis. You may have this done starting at age 103. Mammogram. This may be done every 1-2 years. Talk to your health care provider about how often you should have regular mammograms. Talk with your health care provider about your test results, treatment options, and if necessary, the need for more tests. Vaccines  Your health care provider may recommend certain vaccines, such as: Influenza vaccine. This is recommended every year. Tetanus, diphtheria, and acellular pertussis (Tdap, Td) vaccine. You may need a Td booster every 10 years. Zoster vaccine. You may need this after age 4. Pneumococcal 13-valent conjugate (PCV13) vaccine. One dose is recommended after age 59. Pneumococcal polysaccharide (PPSV23) vaccine. One dose is recommended after age 60. Talk to your health care provider about which screenings and vaccines you need and how often you need them. This information is not intended to replace advice given to you by your health care provider. Make sure you discuss any questions you have with your health care provider. Document Released: 10/24/2015 Document Revised: 06/16/2016 Document Reviewed: 07/29/2015 Elsevier Interactive Patient Education  2017 [place] Prevention in the Home Falls can cause injuries. They can happen to people of all ages. There are many things you can do to make your home safe and to help prevent falls. What can I do on the outside of my home? Regularly fix the edges of  walkways and driveways and fix any cracks. Remove anything that might make you trip as you walk through a door, such as a raised step or threshold. Trim any bushes or trees on the path to your home. Use bright outdoor lighting. Clear any walking paths of anything that might make someone trip, such as rocks or tools. Regularly check to see if handrails are loose or broken. Make sure that both sides of any steps have handrails. Any raised decks and porches should have guardrails on the edges. Have any leaves, snow, or ice cleared regularly. Use sand or salt on walking paths during winter. Clean up any spills in your garage right away. This includes oil or grease spills. What can I do in the bathroom? Use night lights. Install grab bars by the toilet and in the tub and shower. Do not use towel bars as grab bars. Use non-skid  mats or decals in the tub or shower. If you need to sit down in the shower, use a plastic, non-slip stool. Keep the floor dry. Clean up any water that spills on the floor as soon as it happens. Remove soap buildup in the tub or shower regularly. Attach bath mats securely with double-sided non-slip rug tape. Do not have throw rugs and other things on the floor that can make you trip. What can I do in the bedroom? Use night lights. Make sure that you have a light by your bed that is easy to reach. Do not use any sheets or blankets that are too big for your bed. They should not hang down onto the floor. Have a firm chair that has side arms. You can use this for support while you get dressed. Do not have throw rugs and other things on the floor that can make you trip. What can I do in the kitchen? Clean up any spills right away. Avoid walking on wet floors. Keep items that you use a lot in easy-to-reach places. If you need to reach something above you, use a strong step stool that has a grab bar. Keep electrical cords out of the way. Do not use floor polish or wax that  makes floors slippery. If you must use wax, use non-skid floor wax. Do not have throw rugs and other things on the floor that can make you trip. What can I do with my stairs? Do not leave any items on the stairs. Make sure that there are handrails on both sides of the stairs and use them. Fix handrails that are broken or loose. Make sure that handrails are as long as the stairways. Check any carpeting to make sure that it is firmly attached to the stairs. Fix any carpet that is loose or worn. Avoid having throw rugs at the top or bottom of the stairs. If you do have throw rugs, attach them to the floor with carpet tape. Make sure that you have a light switch at the top of the stairs and the bottom of the stairs. If you do not have them, ask someone to add them for you. What else can I do to help prevent falls? Wear shoes that: Do not have high heels. Have rubber bottoms. Are comfortable and fit you well. Are closed at the toe. Do not wear sandals. If you use a stepladder: Make sure that it is fully opened. Do not climb a closed stepladder. Make sure that both sides of the stepladder are locked into place. Ask someone to hold it for you, if possible. Clearly mark and make sure that you can see: Any grab bars or handrails. First and last steps. Where the edge of each step is. Use tools that help you move around (mobility aids) if they are needed. These include: Canes. Walkers. Scooters. Crutches. Turn on the lights when you go into a dark area. Replace any light bulbs as soon as they burn out. Set up your furniture so you have a clear path. Avoid moving your furniture around. If any of your floors are uneven, fix them. If there are any pets around you, be aware of where they are. Review your medicines with your doctor. Some medicines can make you feel dizzy. This can increase your chance of falling. Ask your doctor what other things that you can do to help prevent falls. This  information is not intended to replace advice given to you by your health care provider.  Make sure you discuss any questions you have with your health care provider. Document Released: 07/24/2009 Document Revised: 03/04/2016 Document Reviewed: 11/01/2014 Elsevier Interactive Patient Education  2017 [employer].

## 2022-10-15 NOTE — Progress Notes (Signed)
I connected with Phyllis Johnson today by telephone and verified that I am speaking with the correct person using two identifiers. Location patient: home Location provider: work Persons participating in the virtual visit: Marshal, [place] LPN.   I discussed the limitations, risks, security and privacy concerns of performing an evaluation and management service by telephone and the availability of in person appointments. I also discussed with the patient that there may be a patient responsible charge related to this service. The patient expressed understanding and verbally consented to this telephonic visit.    Interactive audio and video telecommunications were attempted between this provider and patient, however failed, due to patient having technical difficulties OR patient did not have access to video capability.  We continued and completed visit with audio only.     Vital signs may be patient reported or missing.  Subjective:   Phyllis Johnson is a 72 y.o. female who presents for Medicare Annual (Subsequent) preventive examination.  Review of Systems     Cardiac Risk Factors include: advanced age (>90mn, >>68women);hypertension     Objective:    Today's Vitals   10/15/22 1041  Weight: 134 lb (60.8 kg)  Height: 4' 9.5" (1.461 m)   Body mass index is 28.5 kg/m.     10/15/2022   10:48 AM 09/16/2021   12:52 PM 07/13/2021   12:40 PM 07/03/2020    3:06 PM 01/24/2019    1:21 PM 08/27/2018   12:54 PM 08/25/2018    9:28 PM  Advanced Directives  Does Patient Have a Medical Advance Directive? No No No No No No No  Would patient like information on creating a medical advance directive?  No - Patient declined Yes (MAU/Ambulatory/Procedural Areas - Information given) Yes (MAU/Ambulatory/Procedural Areas - Information given) No - Patient declined No - Patient declined No - Patient declined    Current Medications (verified) Outpatient Encounter Medications as of  10/15/2022  Medication Sig   alendronate (FOSAMAX) 70 MG tablet Take 1 tablet (70 mg total) by mouth every 7 (seven) days. Take with a full glass of water on an empty stomach.   amLODipine (NORVASC) 5 MG tablet Take 1 tablet (5 mg total) by mouth daily.   calcium carbonate (OSCAL) 1500 (600 Ca) MG TABS tablet Take 600 mg of elemental calcium by mouth 2 (two) times daily with a meal.   Multiple Vitamin (MULTIVITAMIN) tablet Take 1 tablet by mouth daily.   Omega-3 Fatty Acids (FISH OIL PO) Take by mouth.   valsartan (DIOVAN) 40 MG tablet TAKE 1 TO 2 TABLETS BY MOUTH DAILY, 1 TAB IF BP<140/80, AND 2 TABS IF BP>150/90   zinc gluconate 50 MG tablet Take 50 mg by mouth daily.   No facility-administered encounter medications on file as of 10/15/2022.    Allergies (verified) Patient has no known allergies.   History: Past Medical History:  Diagnosis Date   Allergy    Anxiety    under control with prayer and exercise.   Arrhythmia    "irregular heart beat" during stress test [place]   [place]    [place] obstructive [place] disease) [place]    Depression    in remission with counselling   Hypertension    Pure hypercholesterolemia 01/12/2022   Past Surgical History:  Procedure Laterality Date   ABDOMINAL HYSTERECTOMY     with oophrectomy, secondary to large uterine fibriods   BREAST EXCISIONAL BIOPSY Right    COLONOSCOPY  10/2010   Family History  Problem Relation Age of Onset   Alcohol abuse Mother    Hypertension Father    Stroke Father        hemorrhagic   Heart disease Father    Heart attack Father 62   COPD Sister    Cancer Sister        lung secondary to tobacco use   Fibroids Sister        uterine   Cancer Cousin        breast   Colon cancer Neg Hx    Colon polyps Neg Hx    Social History   Socioeconomic History   Marital status: Married    Spouse name: Not on file   Number of children: 4   Years of education: Not on file   Highest education  level: Not on file  Occupational History   Occupation: retired  Tobacco Use   Smoking status: Former    Packs/day: 1.50    Years: 30.00    Total pack years: 45.00    Types: Cigarettes    Quit date: 2008    Years since quitting: 16.0   Smokeless tobacco: Never  Vaping Use   Vaping Use: Never used  Substance and Sexual Activity   Alcohol use: No   Drug use: No    Comment: Clean since 1993; previous crack-cocaine   Sexual activity: Not Currently  Other Topics Concern   Not on file  Social History Narrative   Not on file   Social Determinants of Health   Financial Resource Strain: Low Risk  (10/15/2022)   Overall Financial Resource Strain (CARDIA)    Difficulty of Paying Living Expenses: Not hard at all  Food Insecurity: No Food Insecurity (10/15/2022)   Hunger Vital Sign    Worried About Running Out of Food in the Last Year: Never true    Ran Out of Food in the Last Year: Never true  Transportation Needs: No Transportation Needs (10/15/2022)   PRAPARE - Hydrologist (Medical): No    Lack of Transportation (Non-Medical): No  Physical Activity: Sufficiently Active (10/15/2022)   Exercise Vital Sign    Days of Exercise per Week: 4 days    Minutes of Exercise per Session: 60 min  Stress: No Stress Concern Present (10/15/2022)   [place]    Feeling of Stress : Only a little  Social Connections: Moderately Isolated (09/16/2021)   Social Connection and Isolation Panel [NHANES]    Frequency of Communication with Friends and Family: Twice a week    Frequency of Social Gatherings with Friends and Family: Twice a week    Attends Religious Services: More than 4 times per year    Active Member of [employer] or Organizations: No    Attends Archivist Meetings: Never    Marital Status: Separated    Tobacco Counseling Counseling given: Not Answered   Clinical Intake:  Pre-visit  preparation completed: Yes  Pain : No/denies pain     Nutritional Status: BMI 25 -29 Overweight Nutritional Risks: Nausea/ vomitting/ diarrhea Diabetes: No  How often do you need to have someone help you when you read instructions, pamphlets, or other written materials from your doctor or pharmacy?: 1 - Never  Diabetic? no  Interpreter Needed?: No  Information entered by :: NAllen LPN   Activities of Daily Living    10/15/2022   10:51 AM 01/12/2022   10:09 AM  In your present  state of health, do you have any difficulty performing the following activities:  Hearing? 0 0  Vision? 0 1  Difficulty concentrating or making decisions? 0 0  Walking or climbing stairs? 0 0  Dressing or bathing? 0 0  Doing errands, shopping? 0 0  Preparing Food and eating ? N   Using the Toilet? N   In the past six months, have you accidently leaked urine? N   Do you have problems with loss of bowel control? N   Managing your Medications? N   Managing your Finances? N   Housekeeping or managing your Housekeeping? N     Patient Care Team: Nche, Charlene Brooke, NP as PCP - General (Internal Medicine) Germaine Pomfret, [facility] as Pharmacist (Pharmacist)  Indicate any recent Medical Services you may have received from other than [facility] providers in the past year (date may be approximate).     Assessment:   This is a routine wellness examination for Armonii.  Hearing/Vision screen Vision Screening - Comments:: Regular eye exams, [facility]  Dietary issues and exercise activities discussed: Current Exercise Habits: Home exercise routine, Type of exercise: strength training/weights;stretching;treadmill;walking, Time (Minutes): 60, Frequency (Times/Week): 4, Weekly Exercise (Minutes/Week): 240   Goals Addressed             This Visit's Progress    Patient Stated       10/15/2022, wants to lose some weight       Depression Screen    10/15/2022   10:51 AM 10/05/2022   10:51 AM 09/16/2021    12:55 PM 07/13/2021   12:21 PM 07/03/2020    3:07 PM 07/25/2019    1:31 PM 01/24/2019    1:23 PM  PHQ 2/9 Scores  PHQ - 2 Score 0 0 0 0 0 0 0  PHQ- 9 Score      0     Fall Risk    10/15/2022   10:51 AM 10/05/2022   10:50 AM 09/16/2021   12:54 PM 09/01/2020   11:15 AM 07/03/2020    3:07 PM  [place] in the past year? 0 0 0 0 0  Number falls in past yr: 0 0 0 0 0  Injury with Fall? 0 0 0 0 0  Risk for fall due to : Medication side effect      Follow up Falls prevention discussed;Education provided;Falls evaluation completed Falls evaluation completed Falls evaluation completed  Falls prevention discussed    FALL RISK PREVENTION PERTAINING TO THE HOME:  Any stairs in or around the home? Yes  If so, are there any without handrails? No  Home free of loose throw rugs in walkways, pet beds, electrical cords, etc? Yes  Adequate lighting in your home to reduce risk of falls? Yes   ASSISTIVE DEVICES UTILIZED TO PREVENT FALLS:  Life alert? No  Use of a cane, walker or w/c? No  Grab bars in the bathroom? Yes  Shower chair or bench in shower? Yes  Elevated toilet seat or a handicapped toilet? No   TIMED UP AND GO:  Was the test performed? No .      Cognitive Function:    10/21/2017    6:12 PM  MMSE - Mini Mental State Exam  Orientation to time 5  Orientation to Place 5  Registration 3  Attention/ Calculation 5  Recall 3  Language- name 2 objects 2  Language- repeat 1  Language- follow 3 step command 3  Language- read & follow  direction 1  Write a sentence 1  Copy design 1  Total score 30        10/15/2022   10:52 AM  6CIT Screen  What Year? 0 points  What month? 0 points  What time? 0 points  Count back from 20 0 points  Months in reverse 0 points  Repeat phrase 0 points  Total Score 0 points    Immunizations Immunization History  Administered Date(s) Administered   Moderna Sars-Covid-2 Vaccination 03/04/2020, 04/04/2020, 08/16/2022   Zoster  Recombinat (Shingrix) 08/16/2022    TDAP status: Due, Education has been provided regarding the importance of this vaccine. Advised may receive this vaccine at local pharmacy or Health Dept. Aware to provide a copy of the vaccination record if obtained from local pharmacy or Health Dept. Verbalized acceptance and understanding.  Flu Vaccine status: Declined, Education has been provided regarding the importance of this vaccine but patient still declined. Advised may receive this vaccine at local pharmacy or Health Dept. Aware to provide a copy of the vaccination record if obtained from local pharmacy or Health Dept. Verbalized acceptance and understanding.  Pneumococcal vaccine status: Declined,  Education has been provided regarding the importance of this vaccine but patient still declined. Advised may receive this vaccine at local pharmacy or Health Dept. Aware to provide a copy of the vaccination record if obtained from local pharmacy or Health Dept. Verbalized acceptance and understanding.   Covid-19 vaccine status: Completed vaccines  Qualifies for Shingles Vaccine? Yes   Zostavax completed No   Shingrix Completed?: needs second dose  Screening Tests Health Maintenance  Topic Date Due   DTaP/Tdap/Td (1 - Tdap) Never done   Medicare Annual Wellness (AWV)  09/16/2022   COVID-19 Vaccine (4 - 2023-24 season) 10/11/2022   Zoster Vaccines- Shingrix (2 of 2) 10/11/2022   INFLUENZA VACCINE  01/09/2023 (Originally 05/11/2022)   Pneumonia Vaccine 97+ Years old (1 - PCV) 01/13/2023 (Originally 01/11/2018)   MAMMOGRAM  07/31/2023   COLONOSCOPY (Pts 45-85yr Insurance coverage will need to be confirmed)  11/17/2028   DEXA SCAN  Completed   Hepatitis C Screening  Completed   HPV VACCINES  Aged Out    Health Maintenance  Health Maintenance Due  Topic Date Due   DTaP/Tdap/Td (1 - Tdap) Never done   Medicare Annual Wellness (AWV)  09/16/2022   COVID-19 Vaccine (4 - 2023-24 season) 10/11/2022    Zoster Vaccines- Shingrix (2 of 2) 10/11/2022    Colorectal cancer screening: Type of screening: Colonoscopy. Completed 11/17/2021. Repeat every 7 years  Mammogram status: Completed 07/30/2022. Repeat every year  Bone Density status: Completed 07/30/2022.   Lung Cancer Screening: (Low Dose CT Chest recommended if Age 70-80years, 30 pack-year currently smoking OR have quit w/in 15years.) does not qualify.   Lung Cancer Screening Referral: no  Additional Screening:  Hepatitis C Screening: does qualify; Completed 09/29/2016  Vision Screening: Recommended annual ophthalmology exams for early detection of glaucoma and other disorders of the eye. Is the patient up to date with their annual eye exam?  Yes  Who is the provider or what is the name of the [facility] which the patient attends [facility] exams[facility] provider, would they like to be referred to a provider to establish care? No .   Dental Screening: Recommended annual dental exams for proper oral hygiene  Community Resource Referral / Chronic Care Management: CRR required this visit?  No   CCM required  this visit?  No      Plan:     I have personally reviewed and noted the following in the patient's chart:   Medical and social history Use of alcohol, tobacco or illicit drugs  Current medications and supplements including opioid prescriptions. Patient is not currently taking opioid prescriptions. Functional ability and status Nutritional status Physical activity Advanced directives List of other physicians Hospitalizations, surgeries, and ER visits in previous 12 months Vitals Screenings to include cognitive, depression, and falls Referrals and appointments  In addition, I have reviewed and discussed with patient certain preventive protocols, quality metrics, and best practice recommendations. A written personalized care plan for preventive services as well as general  preventive health recommendations were provided to patient.     Kellie Simmering, LPN   02/08/7000   Nurse Notes: none  Due to this being a virtual visit, the after visit summary with patients personalized plan was offered to patient via mail or my-chart.  Patient would like to access on my-chart

## 2022-10-19 ENCOUNTER — Encounter: Payer: Self-pay | Admitting: Family Medicine

## 2022-10-19 ENCOUNTER — Ambulatory Visit (INDEPENDENT_AMBULATORY_CARE_PROVIDER_SITE_OTHER): Payer: Medicare Other | Admitting: Family Medicine

## 2022-10-19 VITALS — BP 118/76 | HR 52 | Temp 97.0°F | Ht <= 58 in | Wt 134.2 lb

## 2022-10-19 DIAGNOSIS — R195 Other fecal abnormalities: Secondary | ICD-10-CM | POA: Diagnosis not present

## 2022-10-19 DIAGNOSIS — K602 Anal fissure, unspecified: Secondary | ICD-10-CM

## 2022-10-19 DIAGNOSIS — K649 Unspecified hemorrhoids: Secondary | ICD-10-CM

## 2022-10-19 MED ORDER — HYDROCORTISONE (PERIANAL) 2.5 % EX CREA: 1.0000 | TOPICAL_CREAM | Freq: Two times a day (BID) | CUTANEOUS | 0 refills | Status: DC

## 2022-10-19 NOTE — Progress Notes (Signed)
[facility] [address] [place] [address] Dept: [phone] Dept Fax: [phone]  Office Visit  Subjective:    Patient ID: Phyllis Johnson, female    DOB: 1951/07/25, 72 y.o..   MRN: [id number]  Chief Complaint  Patient presents with   Acute Visit    C/o having changes in her stool x 12- 13 days.  Color/consistency.  Cramping/blood in stool.    History of Present Illness:  Patient is in today for complaining of an acute change in her stool. This happened a few weeks ago. This seemed to occur after she at e some blueberries that may not have been good. These had been fresh berries when she froze them initially. However, they had gone through a thaw and refreezing at one point. The symptoms started the day after she ate these. Phyllis Johnson describes a sudden change with her having some constipation and abdominal cramping. She also had some nausea with dry heaves. She notes her stools suddenly appeared to be a blackish-green and had some powdery consistency in the bowl. Then after several days, she was noting some heavy mucous and some small amounts of blood in the stool. She did have some pain around the rectum. She notes that overall this is improving now. She has increased her intake of fiber, as in eating collards. She is also taking vinegar and prunes. She did have a colonoscopy about a year ago.  Past Medical History: Patient Active Problem List   Diagnosis Date Noted   Hyperglycemia 01/12/2022   Pure hypercholesterolemia 01/12/2022   GAD (generalized anxiety disorder) 06/27/2019   Stress at home 03/07/2018   Atypical chest pain 11/11/2016   Abnormal EKG 11/11/2016   Situational anxiety 09/29/2016   HTN (hypertension), benign 08/31/2016   Chronic bronchitis ([place]) 08/31/2016   Chronic allergic rhinitis 08/31/2016   Past Surgical History:  Procedure Laterality Date   ABDOMINAL HYSTERECTOMY     with oophrectomy,  secondary to large uterine fibriods   BREAST EXCISIONAL BIOPSY Right    COLONOSCOPY  10/2010   Family History  Problem Relation Age of Onset   Alcohol abuse Mother    Hypertension Father    Stroke Father        hemorrhagic   Heart disease Father    Heart attack Father 57   COPD Sister    Cancer Sister        lung secondary to tobacco use   Fibroids Sister        uterine   Cancer Cousin        breast   Colon cancer Neg Hx    Colon polyps Neg Hx    Outpatient Medications Prior to Visit  Medication Sig Dispense Refill   alendronate (FOSAMAX) 70 MG tablet Take 1 tablet (70 mg total) by mouth every 7 (seven) days. Take with a full glass of water on an empty stomach. 12 tablet 3   amLODipine (NORVASC) 5 MG tablet Take 1 tablet (5 mg total) by mouth daily. 90 tablet 3   calcium carbonate (OSCAL) 1500 (600 Ca) MG TABS tablet Take 600 mg of elemental calcium by mouth 2 (two) times daily with a meal.     cholecalciferol (VITAMIN D3) 25 MCG (1000 UNIT) tablet Take 1,000 Units by mouth daily.     Multiple Vitamin (MULTIVITAMIN) tablet Take 1 tablet by mouth daily.     Omega-3 Fatty Acids (FISH OIL PO) Take by mouth.     valsartan (  DIOVAN) 40 MG tablet TAKE 1 TO 2 TABLETS BY MOUTH DAILY, 1 TAB IF BP<140/80, AND 2 TABS IF BP>150/90 180 tablet 1   zinc gluconate 50 MG tablet Take 50 mg by mouth daily.     No facility-administered medications prior to visit.   No Known Allergies    Objective:   Today's Vitals   10/19/22 1300  BP: 118/76  Pulse: (!) 52  Temp: (!) 97 F (36.1 C)  TempSrc: Temporal  SpO2: 97%  Weight: 134 lb 3.2 oz (60.9 kg)  Height: 4' 9.5" (1.461 m)   Body mass index is 28.54 kg/m.   General: Well developed, well nourished. No acute distress. Abdomen: Soft, non-tender. Bowel sounds positive, normal pitch and frequency. No hepatosplenomegaly.   No rebound or guarding. GU: There appears to be a small fissure in the anal verge at about the 12 o'clock poisiton.  There are   several external hemorrhoids, neither of which are inflamed or bleeding. Ann Maki is a palpable hemorrhoid   internally that does not appear thrombosed. Psych: Alert and oriented. Normal mood and affect.  Health Maintenance Due  Topic Date Due   DTaP/Tdap/Td (1 - Tdap) Never done   Zoster Vaccines- Shingrix (2 of 2) 10/11/2022     Assessment & Plan:   1. Anal fissure There appears to be an anal fissure. Phyllis Johnson is using medicated wipes and is increasing her fiber. Both should allow this to heal.  2. Hemorrhoids, unspecified hemorrhoid type Recommend she hydrate well and increase fiber. I will add Anusol cream to help heal the hemorrhoids.  - hydrocortisone (ANUSOL-HC) 2.5 % rectal cream; Place 1 Application rectally 2 (two) times daily.  Dispense: 30 g; Refill: 0  3. Change in stool The stool changes in color and consistency were concurrent with a potential food-borne illness. As this is improving, we should observe this for now. If not resolved in 1 week, I would consider referral to GI, esp. in light of mucous in stool.  Return in about 1 week (around 10/26/2022), or if symptoms worsen or fail to improve.   Haydee Salter, MD

## 2022-10-20 ENCOUNTER — Telehealth: Payer: Self-pay | Admitting: Nurse Practitioner

## 2022-10-20 NOTE — Telephone Encounter (Signed)
Pt would like for you to give her a call . Pt stated that she have a couple of questions to ask

## 2022-10-21 ENCOUNTER — Ambulatory Visit (INDEPENDENT_AMBULATORY_CARE_PROVIDER_SITE_OTHER): Payer: Medicare Other | Admitting: Nurse Practitioner

## 2022-10-21 ENCOUNTER — Telehealth: Payer: Self-pay | Admitting: Nurse Practitioner

## 2022-10-21 ENCOUNTER — Encounter: Payer: Self-pay | Admitting: Nurse Practitioner

## 2022-10-21 VITALS — BP 122/80 | HR 57 | Temp 97.2°F | Ht <= 58 in | Wt 134.0 lb

## 2022-10-21 DIAGNOSIS — K921 Melena: Secondary | ICD-10-CM | POA: Diagnosis not present

## 2022-10-21 DIAGNOSIS — R1032 Left lower quadrant pain: Secondary | ICD-10-CM

## 2022-10-21 DIAGNOSIS — K5790 Diverticulosis of intestine, part unspecified, without perforation or abscess without bleeding: Secondary | ICD-10-CM | POA: Insufficient documentation

## 2022-10-21 LAB — CBC WITH DIFFERENTIAL/PLATELET
Basophils Absolute: 0 10*3/uL (ref 0.0–0.1)
Basophils Relative: 0.4 % (ref 0.0–3.0)
Eosinophils Absolute: 0.1 10*3/uL (ref 0.0–0.7)
Eosinophils Relative: 1 % (ref 0.0–5.0)
HCT: 40.8 % (ref 36.0–46.0)
Hemoglobin: 13.7 g/dL (ref 12.0–15.0)
Lymphocytes Relative: 28.4 % (ref 12.0–46.0)
Lymphs Abs: 1.6 10*3/uL (ref 0.7–4.0)
MCHC: 33.5 g/dL (ref 30.0–36.0)
MCV: 87.7 fl (ref 78.0–100.0)
Monocytes Absolute: 0.4 10*3/uL (ref 0.1–1.0)
Monocytes Relative: 7.5 % (ref 3.0–12.0)
Neutro Abs: 3.5 10*3/uL (ref 1.4–7.7)
Neutrophils Relative %: 62.7 % (ref 43.0–77.0)
Platelets: 410 10*3/uL — ABNORMAL HIGH (ref 150.0–400.0)
RBC: 4.65 Mil/uL (ref 3.87–5.11)
RDW: 14.6 % (ref 11.5–15.5)
WBC: 5.5 10*3/uL (ref 4.0–10.5)

## 2022-10-21 LAB — BASIC METABOLIC PANEL
BUN: 7 mg/dL (ref 6–23)
CO2: 27 mEq/L (ref 19–32)
Calcium: 8.9 mg/dL (ref 8.4–10.5)
Chloride: 101 mEq/L (ref 96–112)
Creatinine, Ser: 0.8 mg/dL (ref 0.40–1.20)
GFR: 74.99 mL/min (ref >60.00)
Glucose, Bld: 103 mg/dL — ABNORMAL HIGH (ref 70–99)
Potassium: 4.1 mEq/L (ref 3.5–5.1)
Sodium: 136 mEq/L (ref 135–145)

## 2022-10-21 NOTE — Telephone Encounter (Signed)
Pt was seen today and had all questions answered

## 2022-10-21 NOTE — Progress Notes (Signed)
Established Patient Visit  Patient: Phyllis Johnson   DOB: 07-06-1951   72 y.o. Female  MRN: [id number] Visit Date: 10/21/2022  Subjective:    Chief Complaint  Patient presents with   Acute Visit    Increased rectal bleeding, abd pain / cramping. Has a lot of mucus in stool  x 2 weeks    HPI No problem-specific Assessment & Plan notes found for this encounter. Phyllis Johnson presents with persistent ABD pain and blood per rectum x 1week. Onset with constipation, then developed rectal pain, then diarrhea with blood and mucus. She use enema at home last week and changed diet to low fat and high fiber. Last episode of diarrhea and mucus in stool was yesterday. Last colonoscopy 11/2021: benign polyp an diverticulosis. 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 Proctosol cream prescribed. She states this has not improved her symptoms.  Reviewed medical, surgical, and social history today  Medications: Outpatient Medications Prior to Visit  Medication Sig   alendronate (FOSAMAX) 70 MG tablet Take 1 tablet (70 mg total) by mouth every 7 (seven) days. Take with a full glass of water on an empty stomach.   amLODipine (NORVASC) 5 MG tablet Take 1 tablet (5 mg total) by mouth daily.   calcium carbonate (OSCAL) 1500 (600 Ca) MG TABS tablet Take 600 mg of elemental calcium by mouth 2 (two) times daily with a meal.   cholecalciferol (VITAMIN D3) 25 MCG (1000 UNIT) tablet Take 1,000 Units by mouth daily.   hydrocortisone (ANUSOL-HC) 2.5 % rectal cream Place 1 Application rectally 2 (two) times daily.   Multiple Vitamin (MULTIVITAMIN) tablet Take 1 tablet by mouth daily.   Omega-3 Fatty Acids (FISH OIL PO) Take by mouth.   valsartan (DIOVAN) 40 MG tablet TAKE 1 TO 2 TABLETS BY MOUTH DAILY, 1 TAB IF BP<140/80, AND 2 TABS IF BP>150/90   zinc gluconate 50 MG tablet Take 50 mg by mouth daily.   No facility-administered medications  prior to visit.   Reviewed past medical and social history.   ROS per HPI above      Objective:  BP 122/80 (BP Location: Right Arm, Patient Position: Sitting, Cuff Size: Small)   Pulse (!) 57   Temp (!) 97.2 F (36.2 C) (Temporal)   Ht '4\' 9"'$  (1.448 m)   Wt 134 lb (60.8 kg)   SpO2 97%   BMI 29.00 kg/m      Physical Exam Constitutional:      General: She is not in acute distress. Cardiovascular:     Rate and Rhythm: Normal rate.     Pulses: Normal pulses.  Pulmonary:     Effort: Pulmonary effort is normal.  Abdominal:     General: Bowel sounds are normal. There is no distension.     Palpations: Abdomen is soft.     Tenderness: There is abdominal tenderness. There is no right CVA tenderness, left CVA tenderness or guarding.  Neurological:     Mental Status: She is alert and oriented to person, place, and time.     No results found for any visits on 10/21/22.    Assessment & Plan:    Problem List Items Addressed This Visit   None Visit Diagnoses     Left lower quadrant abdominal pain    -  Primary   Relevant Orders   Basic metabolic panel  CBC with Differential/Platelet   CT ABDOMEN PELVIS W CONTRAST   Hematochezia       Relevant Orders   Basic metabolic panel   CT ABDOMEN PELVIS W CONTRAST      She declined to start oral abx due to recent completion of 2 oral antibiotic courses for dental abscess last month.  Return if symptoms worsen or fail to improve.     Wilfred Lacy, NP

## 2022-10-21 NOTE — Patient Instructions (Addendum)
Go to lab You will be contacted to schedule appt for ABD CT Go to ED if symptoms worsen

## 2022-10-21 NOTE — Telephone Encounter (Signed)
Caller Name: [facility] [place] Call back phone #: [phone]  Reason for Call: 2 orders placed for pt from appt today, please call back to verify which one they should sched

## 2022-10-27 ENCOUNTER — Ambulatory Visit
Admission: RE | Admit: 2022-10-27 | Discharge: 2022-10-27 | Disposition: A | Discharge status: Home / Self Care | Payer: Medicare Other | Source: Ambulatory Visit | Attending: Nurse Practitioner | Admitting: Nurse Practitioner

## 2022-10-27 DIAGNOSIS — K6389 Other specified diseases of intestine: Secondary | ICD-10-CM | POA: Diagnosis not present

## 2022-10-27 DIAGNOSIS — K921 Melena: Secondary | ICD-10-CM

## 2022-10-27 DIAGNOSIS — R1032 Left lower quadrant pain: Secondary | ICD-10-CM

## 2022-10-27 DIAGNOSIS — K625 Hemorrhage of anus and rectum: Secondary | ICD-10-CM | POA: Diagnosis not present

## 2022-10-27 DIAGNOSIS — M4316 Spondylolisthesis, lumbar region: Secondary | ICD-10-CM | POA: Diagnosis not present

## 2022-10-27 DIAGNOSIS — K5730 Diverticulosis of large intestine without perforation or abscess without bleeding: Secondary | ICD-10-CM | POA: Diagnosis not present

## 2022-10-27 MED ORDER — IOPAMIDOL (ISOVUE-300) INJECTION 61%
80.0000 mL | Freq: Once | INTRAVENOUS | Status: AC | PRN
  Administered 2022-10-27: 80 mL via INTRAVENOUS

## 2022-11-01 ENCOUNTER — Ambulatory Visit: Payer: Medicare Other | Admitting: Nurse Practitioner

## 2022-11-08 DIAGNOSIS — K080 Exfoliation of teeth due to systemic causes: Secondary | ICD-10-CM | POA: Diagnosis not present

## 2022-12-08 DIAGNOSIS — K080 Exfoliation of teeth due to systemic causes: Secondary | ICD-10-CM | POA: Diagnosis not present

## 2022-12-21 DIAGNOSIS — H524 Presbyopia: Secondary | ICD-10-CM | POA: Diagnosis not present

## 2023-01-10 ENCOUNTER — Other Ambulatory Visit: Payer: Self-pay | Admitting: Nurse Practitioner

## 2023-01-10 DIAGNOSIS — I10 Essential (primary) hypertension: Secondary | ICD-10-CM

## 2023-02-02 ENCOUNTER — Other Ambulatory Visit: Payer: Self-pay | Admitting: Nurse Practitioner

## 2023-02-02 DIAGNOSIS — I10 Essential (primary) hypertension: Secondary | ICD-10-CM

## 2023-02-08 DIAGNOSIS — K080 Exfoliation of teeth due to systemic causes: Secondary | ICD-10-CM | POA: Diagnosis not present

## 2023-04-05 DIAGNOSIS — I10 Essential (primary) hypertension: Secondary | ICD-10-CM | POA: Diagnosis not present

## 2023-05-03 ENCOUNTER — Other Ambulatory Visit: Payer: Self-pay | Admitting: Nurse Practitioner

## 2023-05-03 DIAGNOSIS — I10 Essential (primary) hypertension: Secondary | ICD-10-CM

## 2023-06-23 ENCOUNTER — Encounter: Payer: Self-pay | Admitting: Nurse Practitioner

## 2023-07-06 ENCOUNTER — Other Ambulatory Visit: Payer: Self-pay | Admitting: Nurse Practitioner

## 2023-07-06 DIAGNOSIS — I10 Essential (primary) hypertension: Secondary | ICD-10-CM

## 2023-07-06 NOTE — Telephone Encounter (Signed)
Chart supports rx. Last OV: 10/21/2022

## 2023-07-29 ENCOUNTER — Other Ambulatory Visit: Payer: Self-pay | Admitting: Nurse Practitioner

## 2023-07-29 DIAGNOSIS — I10 Essential (primary) hypertension: Secondary | ICD-10-CM

## 2023-08-10 ENCOUNTER — Other Ambulatory Visit: Payer: Self-pay | Admitting: Nurse Practitioner

## 2023-08-10 DIAGNOSIS — Z1231 Encounter for screening mammogram for malignant neoplasm of breast: Secondary | ICD-10-CM

## 2023-08-11 ENCOUNTER — Ambulatory Visit
Admission: RE | Admit: 2023-08-11 | Discharge: 2023-08-11 | Disposition: A | Discharge status: Home / Self Care | Payer: Medicare Other | Source: Ambulatory Visit | Attending: Nurse Practitioner | Admitting: Nurse Practitioner

## 2023-08-11 DIAGNOSIS — Z1231 Encounter for screening mammogram for malignant neoplasm of breast: Secondary | ICD-10-CM

## 2023-09-12 DIAGNOSIS — K080 Exfoliation of teeth due to systemic causes: Secondary | ICD-10-CM | POA: Diagnosis not present

## 2023-10-24 ENCOUNTER — Ambulatory Visit (INDEPENDENT_AMBULATORY_CARE_PROVIDER_SITE_OTHER): Payer: Medicare Other

## 2023-10-24 DIAGNOSIS — Z0000 Encounter for general adult medical examination without abnormal findings: Secondary | ICD-10-CM | POA: Diagnosis not present

## 2023-10-24 NOTE — Patient Instructions (Signed)
 Phyllis Johnson , Thank you for taking time to come for your Medicare Wellness Visit. I appreciate your ongoing commitment to your health goals. Please review the following plan we discussed and let me know if I can assist you in the future.   Referrals/Orders/Follow-Ups/Clinician Recommendations: none  This is a list of the screening recommended for you and due dates:  Health Maintenance  Topic Date Due   COVID-19 Vaccine (4 - 2024-25 season) 11/09/2023*   Flu Shot  01/09/2024*   Zoster (Shingles) Vaccine (2 of 2) 01/22/2024*   Pneumonia Vaccine (1 of 2 - PCV) 10/23/2024*   Mammogram  08/10/2024   Medicare Annual Wellness Visit  10/23/2024   Colon Cancer Screening  11/17/2028   DEXA scan (bone density measurement)  Completed   Hepatitis C Screening  Completed   HPV Vaccine  Aged Out   DTaP/Tdap/Td vaccine  Discontinued  *Topic was postponed. The date shown is not the original due date.    Advanced directives: (Provided) Advance directive discussed with you today. I have provided a copy for you to complete at home and have notarized. Once this is complete, please bring a copy in to our office so we can scan it into your chart.   Next Medicare Annual Wellness Visit scheduled for next year: Yes  insert Preventive Care attachment Insert FALL PREVENTION attachment if needed

## 2023-10-24 NOTE — Progress Notes (Signed)
 Subjective:   Phyllis Johnson is a 73 y.o. female who presents for Medicare Annual (Subsequent) preventive examination.  Visit Complete: Virtual I connected with  Phyllis Johnson on 10/24/23 by a audio enabled telemedicine application and verified that I am speaking with the correct person using two identifiers.  Interactive audio and video telecommunications were attempted between this provider and patient, however failed, due to patient having technical difficulties OR patient did not have access to video capability.  We continued and completed visit with audio only.  Patient Location: Home  Provider Location: Office/Clinic  I discussed the limitations of evaluation and management by telemedicine. The patient expressed understanding and agreed to proceed.  Vital Signs: Because this visit was a virtual/telehealth visit, some criteria may be missing or patient reported. Any vitals not documented were not able to be obtained and vitals that have been documented are patient reported.    Cardiac Risk Factors include: advanced age (>74men, >68 women)     Objective:    Today's Vitals   There is no height or weight on file to calculate BMI.     10/24/2023   11:37 AM 10/15/2022   10:48 AM 09/16/2021   12:52 PM 07/13/2021   12:40 PM 07/03/2020    3:06 PM 01/24/2019    1:21 PM 08/27/2018   12:54 PM  Advanced Directives  Does Patient Have a Medical Advance Directive? No No No No No No No  Would patient like information on creating a medical advance directive?   No - Patient declined Yes (MAU/Ambulatory/Procedural Areas - Information given) Yes (MAU/Ambulatory/Procedural Areas - Information given) No - Patient declined No - Patient declined    Current Medications (verified) Outpatient Encounter Medications as of 10/24/2023  Medication Sig   amLODipine  (NORVASC ) 5 MG tablet TAKE 1 TABLET (5 MG TOTAL) BY MOUTH DAILY.   calcium carbonate (OSCAL) 1500 (600 Ca) MG TABS tablet Take  600 mg of elemental calcium by mouth 2 (two) times daily with a meal.   cholecalciferol (VITAMIN D3) 25 MCG (1000 UNIT) tablet Take 1,000 Units by mouth daily.   Multiple Vitamin (MULTIVITAMIN) tablet Take 1 tablet by mouth daily.   Omega-3 Fatty Acids (FISH OIL PO) Take by mouth.   valsartan  (DIOVAN ) 40 MG tablet TAKE 1 TO 2 TABLETS BY MOUTH DAILY, 1 TAB IF BP<140/80, AND 2 TABS IF BP>150/90   zinc gluconate 50 MG tablet Take 50 mg by mouth daily.   alendronate  (FOSAMAX ) 70 MG tablet Take 1 tablet (70 mg total) by mouth every 7 (seven) days. Take with a full glass of water on an empty stomach.   hydrocortisone  (ANUSOL -HC) 2.5 % rectal cream Place 1 Application rectally 2 (two) times daily.   No facility-administered encounter medications on file as of 10/24/2023.    Allergies (verified) Patient has no known allergies.   History: Past Medical History:  Diagnosis Date   Allergy    Anxiety    under control with prayer and exercise.   Arrhythmia    irregular heart beat during stress test in [place]   Chronic bronchitis (HCC)    COPD (chronic obstructive pulmonary disease) (HCC)    Depression    in remission with counselling   Hypertension    Pure hypercholesterolemia 01/12/2022   Past Surgical History:  Procedure Laterality Date   ABDOMINAL HYSTERECTOMY     with oophrectomy, secondary to large uterine fibriods   BREAST EXCISIONAL BIOPSY Right    COLONOSCOPY  10/2010   Family History  Problem Relation Age of Onset   Alcohol abuse Mother    Hypertension Father    Stroke Father        hemorrhagic   Heart disease Father    Heart attack Father 23   COPD Sister    Cancer Sister        lung secondary to tobacco use   Fibroids Sister        uterine   Cancer Cousin        breast   Colon cancer Neg Hx    Colon polyps Neg Hx    Social History   Socioeconomic History   Marital status: Married    Spouse name: Not on file   Number of children: 4   Years of education: Not on file    Highest education level: Not on file  Occupational History   Occupation: retired  Tobacco Use   Smoking status: Former    Current packs/day: 0.00    Average packs/day: 1.5 packs/day for 30.0 years (45.0 ttl pk-yrs)    Types: Cigarettes    Start date: 94    Quit date: 2008    Years since quitting: 17.0   Smokeless tobacco: Never  Vaping Use   Vaping status: Never Used  Substance and Sexual Activity   Alcohol use: No   Drug use: No    Comment: Clean since 1993; previous crack-cocaine   Sexual activity: Not Currently  Other Topics Concern   Not on file  Social History Narrative   Not on file   Social Drivers of Health   Financial Resource Strain: Low Risk  (10/24/2023)   Overall Financial Resource Strain (CARDIA)    Difficulty of Paying Living Expenses: Not hard at all  Food Insecurity: No Food Insecurity (10/24/2023)   Hunger Vital Sign    Worried About Running Out of Food in the Last Year: Never true    Ran Out of Food in the Last Year: Never true  Transportation Needs: No Transportation Needs (10/24/2023)   PRAPARE - Administrator, Civil Service (Medical): No    Lack of Transportation (Non-Medical): No  Physical Activity: Sufficiently Active (10/24/2023)   Exercise Vital Sign    Days of Exercise per Week: 4 days    Minutes of Exercise per Session: 40 min  Stress: No Stress Concern Present (10/24/2023)   [employer] of Occupational Health - Occupational Stress Questionnaire    Feeling of Stress : Not at all  Social Connections: Socially Integrated (10/24/2023)   Social Connection and Isolation Panel [NHANES]    Frequency of Communication with Friends and Family: More than three times a week    Frequency of Social Gatherings with Friends and Family: More than three times a week    Attends Religious Services: More than 4 times per year    Active Member of [employer] or Organizations: Yes    Attends Engineer, Structural: More than 4 times per year     Marital Status: Married    Tobacco Counseling Counseling given: Not Answered   Clinical Intake:  Pre-visit preparation completed: Yes  Pain : No/denies pain     Nutritional Risks: None Diabetes: No  How often do you need to have someone help you when you read instructions, pamphlets, or other written materials from your doctor or pharmacy?: 1 - Never  Interpreter Needed?: No  Information entered by :: NAllen LPN   Activities of Daily Living    10/24/2023   11:32 AM 10/13/2023  6:30 PM  In your present state of health, do you have any difficulty performing the following activities:  Hearing? 0 0  Vision? 0 0  Difficulty concentrating or making decisions? 0 0  Walking or climbing stairs? 0 0  Dressing or bathing? 0 0  Doing errands, shopping? 0 0  Preparing Food and eating ? N N  Using the Toilet? N N  In the past six months, have you accidently leaked urine? N N  Do you have problems with loss of bowel control? N N  Managing your Medications? N N  Managing your Finances? N N  Housekeeping or managing your Housekeeping? N N    Patient Care Team: Nche, Roselie Rockford, NP as PCP - General (Internal Medicine) Sandria Selma LABOR, [facility] (Inactive) as Pharmacist (Pharmacist)  Indicate any recent Medical Services you may have received from other than [facility] providers in the past year (date may be approximate).     Assessment:   This is a routine wellness examination for Ercell.  Hearing/Vision screen Hearing Screening - Comments:: Denies hearing issues Vision Screening - Comments:: Regular eye exams, [facility]   Goals Addressed             This Visit's Progress    Patient Stated       10/24/2023, stay healthy, continue exercising       Depression Screen    10/24/2023   11:40 AM 10/15/2022   10:51 AM 10/05/2022   10:51 AM 09/16/2021   12:55 PM 07/13/2021   12:21 PM 07/03/2020    3:07 PM 07/25/2019    1:31 PM  PHQ 2/9 Scores  PHQ - 2 Score 0 0 0 0 0  0 0  PHQ- 9 Score       0    Fall Risk    10/24/2023   11:39 AM 10/13/2023    6:30 PM 10/15/2022   10:51 AM 10/05/2022   10:50 AM 09/16/2021   12:54 PM  Fall Risk   Falls in the past year? 0 0 0 0 0  Number falls in past yr: 0  0 0 0  Injury with Fall? 0  0 0 0  Risk for fall due to : Medication side effect  Medication side effect    Follow up Falls prevention discussed;Falls evaluation completed  Falls prevention discussed;Education provided;Falls evaluation completed Falls evaluation completed Falls evaluation completed    MEDICARE RISK AT HOME: Medicare Risk at Home Any stairs in or around the home?: Yes If so, are there any without handrails?: No Home free of loose throw rugs in walkways, pet beds, electrical cords, etc?: Yes Adequate lighting in your home to reduce risk of falls?: Yes Life alert?: No Use of a cane, walker or w/c?: No Grab bars in the bathroom?: Yes Shower chair or bench in shower?: Yes Elevated toilet seat or a handicapped toilet?: No  TIMED UP AND GO:  Was the test performed?  No    Cognitive Function:    10/21/2017    6:12 PM  MMSE - Mini Mental State Exam  Orientation to time 5  Orientation to Place 5  Registration 3  Attention/ Calculation 5  Recall 3  Language- name 2 objects 2  Language- repeat 1  Language- follow 3 step command 3  Language- read & follow direction 1  Write a sentence 1  Copy design 1  Total score 30        10/24/2023   11:40 AM 10/15/2022   10:52  AM  6CIT Screen  What Year? 0 points 0 points  What month? 0 points 0 points  What time? 0 points 0 points  Count back from 20 0 points 0 points  Months in reverse 0 points 0 points  Repeat phrase 0 points 0 points  Total Score 0 points 0 points    Immunizations Immunization History  Administered Date(s) Administered   Moderna Sars-Covid-2 Vaccination 03/04/2020, 04/04/2020, 08/16/2022   Zoster Recombinant(Shingrix) 08/16/2022    TDAP status: Due, Education has  been provided regarding the importance of this vaccine. Advised may receive this vaccine at local pharmacy or Health Dept. Aware to provide a copy of the vaccination record if obtained from local pharmacy or Health Dept. Verbalized acceptance and understanding.  Flu Vaccine status: Declined, Education has been provided regarding the importance of this vaccine but patient still declined. Advised may receive this vaccine at local pharmacy or Health Dept. Aware to provide a copy of the vaccination record if obtained from local pharmacy or Health Dept. Verbalized acceptance and understanding.  Pneumococcal vaccine status: Declined,  Education has been provided regarding the importance of this vaccine but patient still declined. Advised may receive this vaccine at local pharmacy or Health Dept. Aware to provide a copy of the vaccination record if obtained from local pharmacy or Health Dept. Verbalized acceptance and understanding.   Covid-19 vaccine status: Information provided on how to obtain vaccines.   Qualifies for Shingles Vaccine? Yes   Zostavax completed No   Shingrix Completed?: declines second dose  Screening Tests Health Maintenance  Topic Date Due   COVID-19 Vaccine (4 - 2024-25 season) 11/09/2023 (Originally 06/12/2023)   INFLUENZA VACCINE  01/09/2024 (Originally 05/12/2023)   Zoster Vaccines- Shingrix (2 of 2) 01/22/2024 (Originally 10/11/2022)   Pneumonia Vaccine 31+ Years old (1 of 2 - PCV) 10/23/2024 (Originally 01/12/1957)   MAMMOGRAM  08/10/2024   Medicare Annual Wellness (AWV)  10/23/2024   Colonoscopy  11/17/2028   DEXA SCAN  Completed   Hepatitis C Screening  Completed   HPV VACCINES  Aged Out   DTaP/Tdap/Td  Discontinued    Health Maintenance  There are no preventive care reminders to display for this patient.   Colorectal cancer screening: Type of screening: Colonoscopy. Completed 11/17/2021. Repeat every 7 years  Mammogram status: Completed 08/11/2023. Repeat every  year  Bone Density status: Completed 07/30/2022.   Lung Cancer Screening: (Low Dose CT Chest recommended if Age 20-80 years, 20 pack-year currently smoking OR have quit w/in 15years.) does not qualify.   Lung Cancer Screening Referral: no  Additional Screening:  Hepatitis C Screening: does qualify; Completed 09/29/2016  Vision Screening: Recommended annual ophthalmology exams for early detection of glaucoma and other disorders of the eye. Is the patient up to date with their annual eye exam?  Yes  Who is the provider or what is the name of the office in which the patient attends annual eye exams? [facility] If pt is not established with a provider, would they like to be referred to a provider to establish care? No .   Dental Screening: Recommended annual dental exams for proper oral hygiene  Diabetic Foot Exam: n/a  Community Resource Referral / Chronic Care Management: CRR required this visit?  No   CCM required this visit?  No     Plan:     I have personally reviewed and noted the following in the patient's chart:   Medical and social history Use of alcohol, tobacco or illicit drugs  Current medications and supplements including opioid prescriptions. Patient is not currently taking opioid prescriptions. Functional ability and status Nutritional status Physical activity Advanced directives List of other physicians Hospitalizations, surgeries, and ER visits in previous 12 months Vitals Screenings to include cognitive, depression, and falls Referrals and appointments  In addition, I have reviewed and discussed with patient certain preventive protocols, quality metrics, and best practice recommendations. A written personalized care plan for preventive services as well as general preventive health recommendations were provided to patient.     Ardella FORBES Dawn, LPN   [id number]/[id number]   After Visit Summary: (MyChart) Due to this being a telephonic visit, the after visit  summary with patients personalized plan was offered to patient via MyChart   Nurse Notes: none

## 2023-10-27 ENCOUNTER — Other Ambulatory Visit: Payer: Self-pay | Admitting: Nurse Practitioner

## 2023-10-27 DIAGNOSIS — I10 Essential (primary) hypertension: Secondary | ICD-10-CM

## 2023-11-21 DIAGNOSIS — K080 Exfoliation of teeth due to systemic causes: Secondary | ICD-10-CM | POA: Diagnosis not present

## 2024-01-24 ENCOUNTER — Other Ambulatory Visit: Payer: Self-pay | Admitting: Nurse Practitioner

## 2024-01-24 DIAGNOSIS — I10 Essential (primary) hypertension: Secondary | ICD-10-CM

## 2024-01-24 MED ORDER — AMLODIPINE BESYLATE 5 MG PO TABS: 5.0000 mg | ORAL_TABLET | Freq: Every day | ORAL | 0 refills | Status: DC

## 2024-01-24 NOTE — Telephone Encounter (Signed)
 Requesting: amLODipine (NORVASC) 5 MG tablet  Last Visit: 10/21/2022 Next Visit: 10/26/2024 Last Refill: 10/27/2023  Please Advise

## 2024-01-25 ENCOUNTER — Other Ambulatory Visit: Payer: Self-pay | Admitting: Nurse Practitioner

## 2024-01-25 DIAGNOSIS — I10 Essential (primary) hypertension: Secondary | ICD-10-CM

## 2024-01-26 NOTE — Telephone Encounter (Signed)
 Medication: Amlodipine (Norvasc) 5 mg daily  Directions: Take 1 tablet by mouth  Last given: 01/24/24 Number refills: 0 Last o/v: 10/21/22 (acute) 01/12/22 Follow up: 6 months (around 07/14/22) Labs: 10/21/22

## 2024-02-07 ENCOUNTER — Encounter: Payer: Self-pay | Admitting: Nurse Practitioner

## 2024-02-07 ENCOUNTER — Ambulatory Visit (INDEPENDENT_AMBULATORY_CARE_PROVIDER_SITE_OTHER): Admitting: Nurse Practitioner

## 2024-02-07 VITALS — BP 132/70 | HR 56 | Temp 98.0°F | Wt 134.4 lb

## 2024-02-07 DIAGNOSIS — I10 Essential (primary) hypertension: Secondary | ICD-10-CM

## 2024-02-07 DIAGNOSIS — R739 Hyperglycemia, unspecified: Secondary | ICD-10-CM

## 2024-02-07 DIAGNOSIS — E7800 Pure hypercholesterolemia, unspecified: Secondary | ICD-10-CM | POA: Diagnosis not present

## 2024-02-07 DIAGNOSIS — J410 Simple chronic bronchitis: Secondary | ICD-10-CM | POA: Diagnosis not present

## 2024-02-07 LAB — RENAL FUNCTION PANEL
Albumin: 4.5 g/dL (ref 3.5–5.2)
BUN: 13 mg/dL (ref 6–23)
CO2: 29 meq/L (ref 19–32)
Calcium: 9.5 mg/dL (ref 8.4–10.5)
Chloride: 102 meq/L (ref 96–112)
Creatinine, Ser: 0.73 mg/dL (ref 0.40–1.20)
GFR: 82.94 mL/min (ref >60.00)
Glucose, Bld: 97 mg/dL (ref 70–99)
Phosphorus: 3.5 mg/dL (ref 2.3–4.6)
Potassium: 3.9 meq/L (ref 3.5–5.1)
Sodium: 137 meq/L (ref 135–145)

## 2024-02-07 LAB — HEMOGLOBIN A1C: Hgb A1c MFr Bld: 6.1 % (ref 4.6–6.5)

## 2024-02-07 LAB — LDL CHOLESTEROL, DIRECT: Direct LDL: 98 mg/dL

## 2024-02-07 MED ORDER — AMLODIPINE BESYLATE 5 MG PO TABS: 5.0000 mg | ORAL_TABLET | Freq: Every day | ORAL | 0 refills | Status: DC

## 2024-02-07 MED ORDER — AMLODIPINE BESYLATE 5 MG PO TABS: 5.0000 mg | ORAL_TABLET | Freq: Every day | ORAL | 3 refills | Status: DC

## 2024-02-07 MED ORDER — VALSARTAN 40 MG PO TABS: ORAL_TABLET | ORAL | 1 refills | Status: DC

## 2024-02-07 NOTE — Assessment & Plan Note (Addendum)
 BP at goal No adverse effect with valsartan  40mg  and amlodipine  5mg  BP Readings from Last 3 Encounters:  02/07/24 132/70  10/21/22 122/80  10/19/22 118/76   Maintain med dose Repeat BMP F/up in 6months

## 2024-02-07 NOTE — Assessment & Plan Note (Signed)
 Repeat hgbA1c

## 2024-02-07 NOTE — Assessment & Plan Note (Signed)
 Repeat lipid panel ?

## 2024-02-07 NOTE — Assessment & Plan Note (Signed)
Controlled ?No cough, no SOB, no wheezing. ?No need for ICS or SABA ?

## 2024-02-07 NOTE — Patient Instructions (Signed)
 Go to lab Maintain Heart healthy diet and daily exercise. Maintain current medications.

## 2024-02-07 NOTE — Progress Notes (Signed)
 Established Patient Visit  Patient: Phyllis Johnson   DOB: 12-24-1950   73 y.o. Female  MRN: [id number] Visit Date: 02/07/2024  Subjective:    Chief Complaint  Patient presents with   Follow-up    Overdue for follow up   Medication Management    medication refills on Amlodipine      HPI HTN (hypertension), benign BP at goal No adverse effect with valsartan  40mg  and amlodipine  5mg  BP Readings from Last 3 Encounters:  02/07/24 132/70  10/21/22 122/80  10/19/22 118/76   Maintain med dose Repeat BMP F/up in 6months  Hyperglycemia Repeat hgbA1c  Pure hypercholesterolemia Repeat lipid panel  Chronic bronchitis (HCC) Controlled No cough, no SOB, no wheezing. No need for ICS or SABA  BP Readings from Last 3 Encounters:  02/07/24 132/70  10/21/22 122/80  10/19/22 118/76    Wt Readings from Last 3 Encounters:  02/07/24 134 lb 6.4 oz (61 kg)  10/21/22 134 lb (60.8 kg)  10/19/22 134 lb 3.2 oz (60.9 kg)     Reviewed medical, surgical, and social history today  Medications: Outpatient Medications Prior to Visit  Medication Sig   calcium carbonate (OSCAL) 1500 (600 Ca) MG TABS tablet Take 600 mg of elemental calcium by mouth 2 (two) times daily with a meal.   cholecalciferol (VITAMIN D3) 25 MCG (1000 UNIT) tablet Take 1,000 Units by mouth daily.   Multiple Vitamin (MULTIVITAMIN) tablet Take 1 tablet by mouth daily.   Omega-3 Fatty Acids (FISH OIL PO) Take by mouth.   [DISCONTINUED] amLODipine  (NORVASC ) 5 MG tablet Take 1 tablet (5 mg total) by mouth daily. No additional refill without appointment with pcp   [DISCONTINUED] valsartan  (DIOVAN ) 40 MG tablet TAKE 1 TO 2 TABLETS BY MOUTH DAILY, 1 TAB IF BP<140/80, AND 2 TABS IF BP>150/90   zinc gluconate 50 MG tablet Take 50 mg by mouth daily.   [DISCONTINUED] alendronate  (FOSAMAX ) 70 MG tablet Take 1 tablet (70 mg total) by mouth every 7 (seven) days. Take with a full glass of water on an empty stomach.    [DISCONTINUED] hydrocortisone  (ANUSOL -HC) 2.5 % rectal cream Place 1 Application rectally 2 (two) times daily.   No facility-administered medications prior to visit.   Reviewed past medical and social history.   ROS per HPI above      Objective:  BP 132/70 (BP Location: Left Arm, Patient Position: Sitting, Cuff Size: Large)   Pulse (!) 56   Temp 98 F (36.7 C) (Temporal)   Wt 134 lb 6.4 oz (61 kg)   SpO2 99%   BMI 29.08 kg/m      Physical Exam Vitals and nursing note reviewed.  Cardiovascular:     Rate and Rhythm: Normal rate and regular rhythm.     Pulses: Normal pulses.     Heart sounds: Normal heart sounds.  Pulmonary:     Effort: Pulmonary effort is normal.     Breath sounds: Normal breath sounds.  Musculoskeletal:     Right lower leg: No edema.     Left lower leg: No edema.  Neurological:     Mental Status: She is alert and oriented to person, place, and time.     No results found for any visits on 02/07/24.    Assessment & Plan:    Problem List Items Addressed This Visit     Chronic bronchitis (HCC)   Controlled No cough, no SOB, no wheezing.  No need for ICS or SABA      HTN (hypertension), benign - Primary   BP at goal No adverse effect with valsartan  40mg  and amlodipine  5mg  BP Readings from Last 3 Encounters:  02/07/24 132/70  10/21/22 122/80  10/19/22 118/76   Maintain med dose Repeat BMP F/up in 6months      Relevant Medications   amLODipine  (NORVASC ) 5 MG tablet   valsartan  (DIOVAN ) 40 MG tablet   Other Relevant Orders   Renal Function Panel   Hyperglycemia   Repeat hgbA1c      Relevant Orders   Hemoglobin A1c   Pure hypercholesterolemia   Repeat lipid panel      Relevant Medications   amLODipine  (NORVASC ) 5 MG tablet   valsartan  (DIOVAN ) 40 MG tablet   Other Relevant Orders   Direct LDL   Return in about 6 months (around 08/08/2024) for HTN, hyperlipidemia (fasting).     Kathrene Parents, NP

## 2024-02-11 ENCOUNTER — Encounter: Payer: Self-pay | Admitting: Nurse Practitioner

## 2024-07-17 ENCOUNTER — Other Ambulatory Visit: Payer: Self-pay | Admitting: Nurse Practitioner

## 2024-07-17 DIAGNOSIS — Z1231 Encounter for screening mammogram for malignant neoplasm of breast: Secondary | ICD-10-CM

## 2024-08-07 ENCOUNTER — Ambulatory Visit: Admitting: Nurse Practitioner

## 2024-08-13 ENCOUNTER — Encounter: Payer: Self-pay | Admitting: Nurse Practitioner

## 2024-08-13 ENCOUNTER — Ambulatory Visit: Admitting: Nurse Practitioner

## 2024-08-13 ENCOUNTER — Ambulatory Visit (INDEPENDENT_AMBULATORY_CARE_PROVIDER_SITE_OTHER): Admitting: Nurse Practitioner

## 2024-08-13 VITALS — BP 120/70 | HR 60 | Temp 98.1°F | Ht <= 58 in | Wt 128.6 lb

## 2024-08-13 DIAGNOSIS — J309 Allergic rhinitis, unspecified: Secondary | ICD-10-CM

## 2024-08-13 DIAGNOSIS — E7800 Pure hypercholesterolemia, unspecified: Secondary | ICD-10-CM

## 2024-08-13 DIAGNOSIS — I10 Essential (primary) hypertension: Secondary | ICD-10-CM | POA: Diagnosis not present

## 2024-08-13 DIAGNOSIS — R739 Hyperglycemia, unspecified: Secondary | ICD-10-CM

## 2024-08-13 DIAGNOSIS — F411 Generalized anxiety disorder: Secondary | ICD-10-CM

## 2024-08-13 LAB — COMPREHENSIVE METABOLIC PANEL WITH GFR
ALT: 10 U/L (ref 0–35)
AST: 16 U/L (ref 0–37)
Albumin: 4.5 g/dL (ref 3.5–5.2)
Alkaline Phosphatase: 78 U/L (ref 39–117)
BUN: 7 mg/dL (ref 6–23)
CO2: 27 meq/L (ref 19–32)
Calcium: 9 mg/dL (ref 8.4–10.5)
Chloride: 102 meq/L (ref 96–112)
Creatinine, Ser: 0.74 mg/dL (ref 0.40–1.20)
GFR: 81.31 mL/min (ref >60.00)
Glucose, Bld: 92 mg/dL (ref 70–99)
Potassium: 3.8 meq/L (ref 3.5–5.1)
Sodium: 136 meq/L (ref 135–145)
Total Bilirubin: 0.8 mg/dL (ref 0.2–1.2)
Total Protein: 7.3 g/dL (ref 6.0–8.3)

## 2024-08-13 LAB — HEMOGLOBIN A1C: Hgb A1c MFr Bld: 6.3 % (ref 4.6–6.5)

## 2024-08-13 LAB — LIPID PANEL
Cholesterol: 212 mg/dL — ABNORMAL HIGH (ref 0–200)
HDL: 101 mg/dL (ref >39.00)
LDL Cholesterol: 98 mg/dL (ref 0–99)
NonHDL: 111.28
Total CHOL/HDL Ratio: 2
Triglycerides: 65 mg/dL (ref 0.0–149.0)
VLDL: 13 mg/dL (ref 0.0–40.0)

## 2024-08-13 MED ORDER — VALSARTAN 40 MG PO TABS: ORAL_TABLET | ORAL | 1 refills | Status: AC

## 2024-08-13 MED ORDER — AMLODIPINE BESYLATE 5 MG PO TABS: 5.0000 mg | ORAL_TABLET | Freq: Every day | ORAL | 3 refills | Status: AC

## 2024-08-13 NOTE — Assessment & Plan Note (Signed)
 Repeat lipid panel ?

## 2024-08-13 NOTE — Patient Instructions (Signed)
 Go to lab Maintain Heart healthy diet and daily exercise. Maintain current medications.

## 2024-08-13 NOTE — Addendum Note (Signed)
 Addended by: KATHEEN GARDEN L on: 08/13/2024 12:34 PM   Modules accepted: Orders

## 2024-08-13 NOTE — Assessment & Plan Note (Signed)
 Repeat hgbA1c

## 2024-08-13 NOTE — Progress Notes (Addendum)
 Established Patient Visit  Patient: Phyllis Johnson   DOB: 03-Dec-1950   73 y.o. Female  MRN: [id number] Visit Date: 08/13/2024  Subjective:    Chief Complaint  Patient presents with   Follow-up    FASTING Follow up appointment for HTN, Hyperlipidemia    HPI HTN (hypertension), benign BP at goal No adverse effect with valsartan  40mg  and amlodipine  5mg  BP Readings from Last 3 Encounters:  08/13/24 120/70  02/07/24 132/70  10/21/22 122/80   Maintain med dose Repeat CMP F/up in 6months  Chronic allergic rhinitis Seasonal-fall, associated with PND and cough. Advised to use OVER THE COUNTER antihistamine  Hyperglycemia Repeat hgbA1c  Pure hypercholesterolemia Repeat lipid panel   Wt Readings from Last 3 Encounters:  08/13/24 128 lb 9.6 oz (58.3 kg)  02/07/24 134 lb 6.4 oz (61 kg)  10/21/22 134 lb (60.8 kg)    Reviewed medical, surgical, and social history today  Medications: Outpatient Medications Prior to Visit  Medication Sig   calcium carbonate (OSCAL) 1500 (600 Ca) MG TABS tablet Take 600 mg of elemental calcium by mouth 2 (two) times daily with a meal.   cholecalciferol (VITAMIN D3) 25 MCG (1000 UNIT) tablet Take 1,000 Units by mouth daily.   Multiple Vitamin (MULTIVITAMIN) tablet Take 1 tablet by mouth daily.   Omega-3 Fatty Acids (FISH OIL PO) Take by mouth.   zinc gluconate 50 MG tablet Take 50 mg by mouth daily.   [DISCONTINUED] amLODipine  (NORVASC ) 5 MG tablet Take 1 tablet (5 mg total) by mouth daily.   [DISCONTINUED] valsartan  (DIOVAN ) 40 MG tablet TAKE 1 TO 2 TABLETS BY MOUTH DAILY, 1 TAB IF BP<140/80, AND 2 TABS IF BP>150/90   No facility-administered medications prior to visit.   Reviewed past medical and social history.   ROS per HPI above      Objective:  BP 120/70 (BP Location: Left Arm, Patient Position: Sitting, Cuff Size: Normal)   Pulse 60   Temp 98.1 F (36.7 C) (Temporal)   Ht 4' 9 (1.448 m)   Wt 128 lb 9.6 oz  (58.3 kg)   SpO2 99%   BMI 27.83 kg/m      Physical Exam Cardiovascular:     Rate and Rhythm: Normal rate and regular rhythm.     Pulses: Normal pulses.     Heart sounds: Normal heart sounds.  Pulmonary:     Effort: Pulmonary effort is normal.     Breath sounds: Normal breath sounds.  Neurological:     Mental Status: She is alert and oriented to person, place, and time.     No results found for any visits on 08/13/24.    Assessment & Plan:    Problem List Items Addressed This Visit     Chronic allergic rhinitis   Seasonal-fall, associated with PND and cough. Advised to use OVER THE COUNTER antihistamine      GAD (generalized anxiety disorder)   HTN (hypertension), benign   BP at goal No adverse effect with valsartan  40mg  and amlodipine  5mg  BP Readings from Last 3 Encounters:  08/13/24 120/70  02/07/24 132/70  10/21/22 122/80   Maintain med dose Repeat CMP F/up in 6months      Relevant Medications   amLODipine  (NORVASC ) 5 MG tablet   valsartan  (DIOVAN ) 40 MG tablet   Other Relevant Orders   Comprehensive metabolic panel with GFR   Hyperglycemia   Repeat hgbA1c  Relevant Orders   Hemoglobin A1c   Comprehensive metabolic panel with GFR   Pure hypercholesterolemia - Primary   Repeat lipid panel      Relevant Medications   amLODipine  (NORVASC ) 5 MG tablet   valsartan  (DIOVAN ) 40 MG tablet   Other Relevant Orders   Lipid Profile   Comprehensive metabolic panel with GFR   Return in about 6 months (around 02/10/2025) for CPE (fasting).     Roselie Mood, NP

## 2024-08-13 NOTE — Assessment & Plan Note (Signed)
 BP at goal No adverse effect with valsartan  40mg  and amlodipine  5mg  BP Readings from Last 3 Encounters:  08/13/24 120/70  02/07/24 132/70  10/21/22 122/80   Maintain med dose Repeat CMP F/up in 6months

## 2024-08-13 NOTE — Assessment & Plan Note (Signed)
 Seasonal-fall, associated with PND and cough. Advised to use OVER THE COUNTER antihistamine

## 2024-08-14 ENCOUNTER — Ambulatory Visit
Admission: RE | Admit: 2024-08-14 | Discharge: 2024-08-14 | Disposition: A | Discharge status: Home / Self Care | Source: Ambulatory Visit | Attending: Nurse Practitioner | Admitting: Nurse Practitioner

## 2024-08-14 DIAGNOSIS — Z1231 Encounter for screening mammogram for malignant neoplasm of breast: Secondary | ICD-10-CM

## 2024-08-15 ENCOUNTER — Ambulatory Visit: Payer: Self-pay | Admitting: Nurse Practitioner

## 2024-08-15 ENCOUNTER — Encounter: Payer: Self-pay | Admitting: Nurse Practitioner

## 2024-10-26 ENCOUNTER — Ambulatory Visit: Payer: Medicare Other

## 2024-10-30 ENCOUNTER — Ambulatory Visit

## 2024-10-30 VITALS — Ht <= 58 in | Wt 130.0 lb

## 2024-10-30 DIAGNOSIS — Z0000 Encounter for general adult medical examination without abnormal findings: Secondary | ICD-10-CM

## 2024-10-30 NOTE — Patient Instructions (Signed)
 [name],  Thank you for taking the time for your Medicare Wellness Visit. I appreciate your continued commitment to your health goals. Please review the care plan we discussed, and feel free to reach out if I can assist you further.  Please note that Annual Wellness Visits do not include a physical exam. Some assessments may be limited, especially if the visit was conducted virtually. If needed, we may recommend an in-person follow-up with your provider.  Ongoing Care Seeing your primary care provider every 3 to 6 months helps us  monitor your health and provide consistent, personalized care.   Referrals If a referral was made during today's visit and you haven't received any updates within two weeks, please contact the referred provider directly to check on the status.  Recommended Screenings:  Health Maintenance  Topic Date Due   Pneumococcal Vaccine for age over 38 (1 of 2 - PCV) Never done   COVID-19 Vaccine (4 - 2025-26 season) 06/11/2024   Medicare Annual Wellness Visit  10/23/2024   Zoster (Shingles) Vaccine (2 of 2) 11/13/2024*   Flu Shot  01/08/2025*   Breast Cancer Screening  08/14/2025   Colon Cancer Screening  11/17/2028   Osteoporosis screening with Bone Density Scan  Completed   Hepatitis C Screening  Completed   Meningitis B Vaccine  Aged Out   DTaP/Tdap/Td vaccine  Discontinued  *Topic was postponed. The date shown is not the original due date.       10/30/2024   11:25 AM  Advanced Directives  Does Patient Have a Medical Advance Directive? No  Would patient like information on creating a medical advance directive? No - Patient declined    Vision: Annual vision screenings are recommended for early detection of glaucoma, cataracts, and diabetic retinopathy. These exams can also reveal signs of chronic conditions such as diabetes and high blood pressure.  Dental: Annual dental screenings help detect early signs of oral cancer, gum disease, and other conditions  linked to overall health, including heart disease and diabetes.  Please see the attached documents for additional preventive care recommendations.

## 2024-10-30 NOTE — Progress Notes (Signed)
 "  Chief Complaint  Patient presents with   Medicare Wellness     Subjective:   Phyllis Johnson is a 74 y.o. female who presents for a Medicare Annual Wellness Visit.  Visit info / Clinical Intake: Medicare Wellness Visit Type:: Subsequent Annual Wellness Visit Persons participating in visit and providing information:: patient Medicare Wellness Visit Mode:: Video Since this visit was completed virtually, some vitals may be partially provided or unavailable. Missing vitals are due to the limitations of the virtual format.: Documented vitals are patient reported If Telephone or Video please confirm:: I connected with patient using audio/video enable telemedicine. I verified patient identity with two identifiers, discussed telehealth limitations, and patient agreed to proceed. Patient Location:: home Provider Location:: office Interpreter Needed?: No Pre-visit prep was completed: yes AWV questionnaire completed by patient prior to visit?: no Living arrangements:: lives with spouse/significant other Patient's Overall Health Status Rating: very good Typical amount of pain: none Does pain affect daily life?: no Are you currently prescribed opioids?: no  Dietary Habits and Nutritional Risks How many meals a day?: 2 Eats fruit and vegetables daily?: yes Most meals are obtained by: preparing own meals In the last 2 weeks, have you had any of the following?: none Diabetic:: no  Functional Status Activities of Daily Living (to include ambulation/medication): Independent Ambulation: Independent Medication Administration: Independent Home Management (perform basic housework or laundry): Independent Manage your own finances?: yes Primary transportation is: driving Concerns about vision?: no *vision screening is required for WTM* Concerns about hearing?: no  Fall Screening Falls in the past year?: 0 Number of falls in past year: 0 Was there an injury with Fall?: 0 Fall Risk  Category Calculator: 0 Patient Fall Risk Level: Low Fall Risk  Fall Risk Patient at Risk for Falls Due to: Medication side effect Fall risk Follow up: Falls evaluation completed; Education provided; Falls prevention discussed  Home and Transportation Safety: All rugs have non-skid backing?: yes All stairs or steps have railings?: yes Grab bars in the bathtub or shower?: yes Have non-skid surface in bathtub or shower?: yes Good home lighting?: yes Regular seat belt use?: yes Hospital stays in the last year:: no  Cognitive Assessment Difficulty concentrating, remembering, or making decisions? : no Will 6CIT or Mini Cog be Completed: yes What year is it?: 0 points What month is it?: 0 points Give patient an address phrase to remember (5 components): [address] [place] About what time is it?: 0 points Count backwards from 20 to 1: 0 points Say the months of the year in reverse: 0 points Repeat the address phrase from earlier: 0 points 6 CIT Score: 0 points  Advance Directives (For Healthcare) Does Patient Have a Medical Advance Directive?: No Would patient like information on creating a medical advance directive?: No - Patient declined  Reviewed/Updated  Reviewed/Updated: Reviewed All (Medical, Surgical, Family, Medications, Allergies, Care Teams, Patient Goals)    Allergies (verified) Patient has no known allergies.   Current Medications (verified) Outpatient Encounter Medications as of 10/30/2024  Medication Sig   amLODipine  (NORVASC ) 5 MG tablet Take 1 tablet (5 mg total) by mouth daily.   calcium carbonate (OSCAL) 1500 (600 Ca) MG TABS tablet Take 600 mg of elemental calcium by mouth 2 (two) times daily with a meal.   cholecalciferol (VITAMIN D3) 25 MCG (1000 UNIT) tablet Take 1,000 Units by mouth daily.   Multiple Vitamin (MULTIVITAMIN) tablet Take 1 tablet by mouth daily.   Omega-3 Fatty Acids (FISH OIL PO)  Take by mouth.   valsartan  (DIOVAN ) 40 MG tablet TAKE 1  TO 2 TABLETS BY MOUTH DAILY, 1 TAB IF BP<140/80, AND 2 TABS IF BP>150/90   zinc gluconate 50 MG tablet Take 50 mg by mouth daily.   No facility-administered encounter medications on file as of 10/30/2024.    History: Past Medical History:  Diagnosis Date   Allergy    Anxiety    under control with prayer and exercise.   Arrhythmia    irregular heart beat during stress test in [place]   Atypical chest pain 11/11/2016   Chronic bronchitis (HCC)    COPD (chronic obstructive pulmonary disease) (HCC)    Depression    in remission with counselling   Hypertension    Pure hypercholesterolemia 01/12/2022   Stress at home 03/07/2018   Past Surgical History:  Procedure Laterality Date   ABDOMINAL HYSTERECTOMY     with oophrectomy, secondary to large uterine fibriods   BREAST EXCISIONAL BIOPSY Right    COLONOSCOPY  10/2010   Family History  Problem Relation Age of Onset   Alcohol abuse Mother    Hypertension Father    Stroke Father        hemorrhagic   Heart disease Father    Heart attack Father 42   COPD Sister    Cancer Sister        lung secondary to tobacco use   Fibroids Sister        uterine   Cancer Cousin        breast   Colon cancer Neg Hx    Colon polyps Neg Hx    Social History   Occupational History   Occupation: retired  Tobacco Use   Smoking status: Former    Current packs/day: 0.00    Average packs/day: 1.5 packs/day for 30.0 years (45.0 ttl pk-yrs)    Types: Cigarettes    Start date: 36    Quit date: 2008    Years since quitting: 18.0   Smokeless tobacco: Never  Vaping Use   Vaping status: Never Used  Substance and Sexual Activity   Alcohol use: No   Drug use: No    Comment: Clean since 1993; previous crack-cocaine   Sexual activity: Not Currently   Tobacco Counseling Counseling given: Not Answered  SDOH Screenings   Food Insecurity: No Food Insecurity (10/30/2024)  Housing: Unknown (10/30/2024)  Transportation Needs: No Transportation Needs  (10/30/2024)  Utilities: Not At Risk (10/30/2024)  Alcohol Screen: Low Risk (10/30/2024)  Depression (PHQ2-9): Low Risk (10/30/2024)  Financial Resource Strain: Low Risk (10/30/2024)  Physical Activity: Insufficiently Active (10/30/2024)  Social Connections: Moderately Integrated (10/30/2024)  Stress: No Stress Concern Present (10/30/2024)  Tobacco Use: Medium Risk (10/30/2024)  Health Literacy: Adequate Health Literacy (10/30/2024)   See flowsheets for full screening details  Depression Screen PHQ 2 & 9 Depression Scale- Over the past 2 weeks, how often have you been bothered by any of the following problems? Little interest or pleasure in doing things: 0 Feeling down, depressed, or hopeless (PHQ Adolescent also includes...irritable): 0 PHQ-2 Total Score: 0 Trouble falling or staying asleep, or sleeping too much: 0 Feeling tired or having little energy: 0 Poor appetite or overeating (PHQ Adolescent also includes...weight loss): 0 Feeling bad about yourself - or that you are a failure or have let yourself or your family down: 0 Trouble concentrating on things, such as reading the newspaper or watching television (PHQ Adolescent also includes...like school work): 0 Moving or speaking so slowly  that other people could have noticed. Or the opposite - being so fidgety or restless that you have been moving around a lot more than usual: 0 Thoughts that you would be better off dead, or of hurting yourself in some way: 0 PHQ-9 Total Score: 0 If you checked off any problems, how difficult have these problems made it for you to do your work, take care of things at home, or get along with other people?: Not difficult at all  Depression Treatment Depression Interventions/Treatment : [id number] Score <4 Follow-up Not Indicated     Goals Addressed               This Visit's Progress     Try not overeat. Maintain or lose weight. Eat healthy (pt-stated)               Objective:    Today's Vitals    10/30/24 1120  Weight: 130 lb (59 kg)  Height: 4' 9 (1.448 m)   Body mass index is 28.13 kg/m.  Hearing/Vision screen Vision Screening - Comments:: Regular eye exams Immunizations and Health Maintenance Health Maintenance  Topic Date Due   Pneumococcal Vaccine: 50+ Years (1 of 2 - PCV) Never done   COVID-19 Vaccine (4 - 2025-26 season) 06/11/2024   Zoster Vaccines- Shingrix (2 of 2) 11/13/2024 (Originally 10/11/2022)   Influenza Vaccine  01/08/2025 (Originally 05/11/2024)   Mammogram  08/14/2025   Medicare Annual Wellness (AWV)  10/30/2025   Colonoscopy  11/17/2028   Bone Density Scan  Completed   Hepatitis C Screening  Completed   Meningococcal B Vaccine  Aged Out   DTaP/Tdap/Td  Discontinued        Assessment/Plan:  This is a routine wellness examination for Phyllis Johnson.  Patient Care Team: Nche, Roselie Rockford, NP as PCP - General (Internal Medicine) Robinson Mayo, OD as Referring Physician (Optometry)  I have personally reviewed and noted the following in the patients chart:   Medical and social history Use of alcohol, tobacco or illicit drugs  Current medications and supplements including opioid prescriptions. Functional ability and status Nutritional status Physical activity Advanced directives List of other physicians Hospitalizations, surgeries, and ER visits in previous 12 months Vitals Screenings to include cognitive, depression, and falls Referrals and appointments  No orders of the defined types were placed in this encounter.  In addition, I have reviewed and discussed with patient certain preventive protocols, quality metrics, and best practice recommendations. A written personalized care plan for preventive services as well as general preventive health recommendations were provided to patient.   Ardella FORBES Dawn, LPN   8/79/7973   Return in 54 weeks (on 11/12/2025) for Medicare Wellness Visit.  After Visit Summary: (MyChart) Due to this being a telephonic  visit, the after visit summary with patients personalized plan was offered to patient via MyChart   Nurse Notes: Vaccines not given: covid and pneumonia declined today  "

## 2025-02-11 ENCOUNTER — Encounter: Admitting: Nurse Practitioner

## 2025-11-12 ENCOUNTER — Ambulatory Visit
